# Patient Record
Sex: Male | Born: 1950 | Race: White | Hispanic: No | Marital: Married | Smoking: Former smoker
Health system: Southern US, Community
[De-identification: ages and names within clinical notes are randomized; demographics above are authoritative.]

## PROBLEM LIST (undated history)

## (undated) DIAGNOSIS — Z720 Tobacco use: Secondary | ICD-10-CM

## (undated) DIAGNOSIS — I10 Essential (primary) hypertension: Secondary | ICD-10-CM

## (undated) DIAGNOSIS — F419 Anxiety disorder, unspecified: Secondary | ICD-10-CM

## (undated) DIAGNOSIS — J449 Chronic obstructive pulmonary disease, unspecified: Secondary | ICD-10-CM

## (undated) DIAGNOSIS — F1910 Other psychoactive substance abuse, uncomplicated: Secondary | ICD-10-CM

## (undated) DIAGNOSIS — J189 Pneumonia, unspecified organism: Secondary | ICD-10-CM

## (undated) DIAGNOSIS — E782 Mixed hyperlipidemia: Secondary | ICD-10-CM

## (undated) DIAGNOSIS — N059 Unspecified nephritic syndrome with unspecified morphologic changes: Secondary | ICD-10-CM

## (undated) DIAGNOSIS — M109 Gout, unspecified: Secondary | ICD-10-CM

## (undated) HISTORY — DX: Mixed hyperlipidemia: E78.2

## (undated) HISTORY — DX: Anxiety disorder, unspecified: F41.9

## (undated) HISTORY — DX: Essential (primary) hypertension: I10

## (undated) HISTORY — PX: TONSILLECTOMY: SUR1361

---

## 2002-09-17 ENCOUNTER — Encounter: Payer: Self-pay | Admitting: Family Medicine

## 2002-09-17 ENCOUNTER — Ambulatory Visit (HOSPITAL_COMMUNITY): Admission: RE | Admit: 2002-09-17 | Discharge: 2002-09-17 | Payer: Self-pay | Admitting: Family Medicine

## 2004-11-02 ENCOUNTER — Other Ambulatory Visit: Admission: RE | Admit: 2004-11-02 | Discharge: 2004-11-02 | Payer: Self-pay | Admitting: Dermatology

## 2006-12-07 ENCOUNTER — Ambulatory Visit (HOSPITAL_COMMUNITY): Admission: RE | Admit: 2006-12-07 | Discharge: 2006-12-07 | Payer: Self-pay | Admitting: Family Medicine

## 2014-10-25 ENCOUNTER — Emergency Department (HOSPITAL_COMMUNITY): Payer: PRIVATE HEALTH INSURANCE

## 2014-10-25 ENCOUNTER — Emergency Department (HOSPITAL_COMMUNITY)
Admission: EM | Admit: 2014-10-25 | Discharge: 2014-10-25 | Disposition: A | Discharge status: Home / Self Care | Payer: PRIVATE HEALTH INSURANCE | Attending: Emergency Medicine | Admitting: Emergency Medicine

## 2014-10-25 ENCOUNTER — Encounter (HOSPITAL_COMMUNITY): Payer: Self-pay

## 2014-10-25 DIAGNOSIS — R0600 Dyspnea, unspecified: Secondary | ICD-10-CM | POA: Insufficient documentation

## 2014-10-25 DIAGNOSIS — R2242 Localized swelling, mass and lump, left lower limb: Secondary | ICD-10-CM | POA: Insufficient documentation

## 2014-10-25 DIAGNOSIS — Z87448 Personal history of other diseases of urinary system: Secondary | ICD-10-CM | POA: Diagnosis not present

## 2014-10-25 DIAGNOSIS — M109 Gout, unspecified: Secondary | ICD-10-CM

## 2014-10-25 DIAGNOSIS — Z8701 Personal history of pneumonia (recurrent): Secondary | ICD-10-CM | POA: Insufficient documentation

## 2014-10-25 DIAGNOSIS — R4182 Altered mental status, unspecified: Secondary | ICD-10-CM | POA: Diagnosis not present

## 2014-10-25 DIAGNOSIS — M10062 Idiopathic gout, left knee: Secondary | ICD-10-CM | POA: Diagnosis not present

## 2014-10-25 DIAGNOSIS — Z720 Tobacco use: Secondary | ICD-10-CM | POA: Insufficient documentation

## 2014-10-25 DIAGNOSIS — R000 Tachycardia, unspecified: Secondary | ICD-10-CM | POA: Diagnosis not present

## 2014-10-25 DIAGNOSIS — M7989 Other specified soft tissue disorders: Secondary | ICD-10-CM

## 2014-10-25 DIAGNOSIS — R0609 Other forms of dyspnea: Secondary | ICD-10-CM

## 2014-10-25 HISTORY — DX: Pneumonia, unspecified organism: J18.9

## 2014-10-25 HISTORY — DX: Unspecified nephritic syndrome with unspecified morphologic changes: N05.9

## 2014-10-25 HISTORY — DX: Gout, unspecified: M10.9

## 2014-10-25 LAB — CBC WITH DIFFERENTIAL/PLATELET
Basophils Absolute: 0 K/uL (ref 0.0–0.1)
Basophils Relative: 0 % (ref 0–1)
Eosinophils Absolute: 0 K/uL (ref 0.0–0.7)
Eosinophils Relative: 0 % (ref 0–5)
HCT: 42.1 % (ref 39.0–52.0)
Hemoglobin: 14.7 g/dL (ref 13.0–17.0)
Lymphocytes Relative: 11 % — ABNORMAL LOW (ref 12–46)
Lymphs Abs: 1.6 K/uL (ref 0.7–4.0)
MCH: 36.3 pg — ABNORMAL HIGH (ref 26.0–34.0)
MCHC: 34.9 g/dL (ref 30.0–36.0)
MCV: 104 fL — ABNORMAL HIGH (ref 78.0–100.0)
Monocytes Absolute: 2.9 K/uL — ABNORMAL HIGH (ref 0.1–1.0)
Monocytes Relative: 21 % — ABNORMAL HIGH (ref 3–12)
Neutro Abs: 9.3 K/uL — ABNORMAL HIGH (ref 1.7–7.7)
Neutrophils Relative %: 67 % (ref 43–77)
Platelets: 235 K/uL (ref 150–400)
RBC: 4.05 MIL/uL — ABNORMAL LOW (ref 4.22–5.81)
RDW: 12.9 % (ref 11.5–15.5)
WBC: 13.8 K/uL — ABNORMAL HIGH (ref 4.0–10.5)

## 2014-10-25 LAB — BASIC METABOLIC PANEL WITH GFR
Anion gap: 9 (ref 5–15)
BUN: 10 mg/dL (ref 6–23)
CO2: 23 mmol/L (ref 19–32)
Calcium: 8.3 mg/dL — ABNORMAL LOW (ref 8.4–10.5)
Chloride: 98 meq/L (ref 96–112)
Creatinine, Ser: 0.54 mg/dL (ref 0.50–1.35)
GFR calc Af Amer: >90 mL/min
GFR calc non Af Amer: >90 mL/min
Glucose, Bld: 126 mg/dL — ABNORMAL HIGH (ref 70–99)
Potassium: 3.2 mmol/L — ABNORMAL LOW (ref 3.5–5.1)
Sodium: 130 mmol/L — ABNORMAL LOW (ref 135–145)

## 2014-10-25 LAB — URIC ACID: URIC ACID, SERUM: 8 mg/dL — AB (ref 4.0–7.8)

## 2014-10-25 LAB — ETHANOL: Alcohol, Ethyl (B): <5 mg/dL (ref 0–9)

## 2014-10-25 MED ORDER — KETOROLAC TROMETHAMINE 30 MG/ML IJ SOLN
30.0000 mg | Freq: Once | INTRAMUSCULAR | Status: AC
  Administered 2014-10-25: 30 mg via INTRAVENOUS
  Filled 2014-10-25: qty 1

## 2014-10-25 MED ORDER — OXYCODONE-ACETAMINOPHEN 5-325 MG PO TABS
2.0000 | ORAL_TABLET | Freq: Once | ORAL | Status: AC
  Administered 2014-10-25: 2 via ORAL
  Filled 2014-10-25: qty 2

## 2014-10-25 MED ORDER — COLCHICINE 0.6 MG PO TABS
0.6000 mg | ORAL_TABLET | Freq: Once | ORAL | Status: AC
  Administered 2014-10-25: 0.6 mg via ORAL
  Filled 2014-10-25: qty 1

## 2014-10-25 MED ORDER — IOHEXOL 350 MG/ML SOLN
100.0000 mL | Freq: Once | INTRAVENOUS | Status: AC | PRN
  Administered 2014-10-25: 100 mL via INTRAVENOUS

## 2014-10-25 MED ORDER — SODIUM CHLORIDE 0.9 % IV SOLN
INTRAVENOUS | Status: DC
  Administered 2014-10-25: 03:00:00 via INTRAVENOUS

## 2014-10-25 NOTE — ED Notes (Signed)
Patient states he noticed swelling to left knee 3 days ago. Patient has been flying for the past 30 hours, when landed tonight patient unable to ambulate due to pain and swelling to bilateral feet. Patient also states shortness of breath.
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
## 2014-10-25 NOTE — ED Notes (Signed)
Pt ambulated well, with no difficulty.

## 2014-10-25 NOTE — ED Provider Notes (Signed)
7:02 AM Received sign out from Dr. Tomi Bamberger 64 y.o. Male recent trip overseas who presents complaining of left leg pain with known gout and doe.  CT angio ok.  Doppler US of leg at 0900.  CT of head due to patient "not acting right" but bottle of xanax in car.  Likely d/c after doppler.   Shaune Pollack, MD 10/29/14 [phone]

## 2014-10-25 NOTE — ED Provider Notes (Signed)
This note was scribed for Cesar Pollack, MD by Stephania Fragmin, ED Scribe. This patient was seen in room APA08/[facility]08 at 11:34 AM.   11:34 AM - Patient has been informed of negative US impressions of DVT and positive impressions of Baker cysts. He verbalizes understanding, although he has concerns about ambulating. Patient is offered overnight stay for monitoring of leg pain.  Patient states his BLE pain has decreased since coming here, although he still has left knee pain. He believes his symptoms are due to a flareup of gout. He denies fever and history of pedal edema. His PCP is Dr. Gerarda Fraction.   Per wife, patient had a blood panel recently done, with triglycerides elevated at 722.  Patient with [place] ble with no dvt PE bilateral foot swelling with left knee fluid Patient ambulated and again with walker.  Feels improved with walker- plan outpatient rx  Patient feels swelling like prior gout and will continue gout meds.  Patient advised re avoiding mental status altering medication.  Likely some erythema and swelling superimposed from extended flight time- patient advised to keep feet up down only when walking.  Advised of return precautions an dneed for follow up and voices understanding.   Cesar Pollack, MD 10/25/14 [phone]

## 2014-10-25 NOTE — ED Notes (Signed)
Patient resting, eyes closed, even rise and fall of chest. No distress noted.

## 2014-10-25 NOTE — Discharge Instructions (Signed)
Please keep feet elevated above heart except when walking.  Recheck with Dr. Gerarda Fraction on Monday.  Return if worse at any time.  Avoid all sedative medications.
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
## 2014-12-07 ENCOUNTER — Emergency Department (HOSPITAL_COMMUNITY)
Admission: EM | Admit: 2014-12-07 | Discharge: 2014-12-07 | Disposition: A | Discharge status: Home / Self Care | Payer: PRIVATE HEALTH INSURANCE | Attending: Emergency Medicine | Admitting: Emergency Medicine

## 2014-12-07 ENCOUNTER — Encounter (HOSPITAL_COMMUNITY): Payer: Self-pay | Admitting: Emergency Medicine

## 2014-12-07 ENCOUNTER — Emergency Department (HOSPITAL_COMMUNITY): Payer: PRIVATE HEALTH INSURANCE

## 2014-12-07 DIAGNOSIS — Z720 Tobacco use: Secondary | ICD-10-CM | POA: Diagnosis not present

## 2014-12-07 DIAGNOSIS — Z87448 Personal history of other diseases of urinary system: Secondary | ICD-10-CM | POA: Diagnosis not present

## 2014-12-07 DIAGNOSIS — J159 Unspecified bacterial pneumonia: Secondary | ICD-10-CM | POA: Diagnosis not present

## 2014-12-07 DIAGNOSIS — Z79899 Other long term (current) drug therapy: Secondary | ICD-10-CM | POA: Diagnosis not present

## 2014-12-07 DIAGNOSIS — R509 Fever, unspecified: Secondary | ICD-10-CM | POA: Diagnosis present

## 2014-12-07 DIAGNOSIS — M109 Gout, unspecified: Secondary | ICD-10-CM | POA: Diagnosis not present

## 2014-12-07 DIAGNOSIS — J189 Pneumonia, unspecified organism: Secondary | ICD-10-CM

## 2014-12-07 MED ORDER — CEFDINIR 300 MG PO CAPS
300.0000 mg | ORAL_CAPSULE | Freq: Two times a day (BID) | ORAL | Status: DC
Start: 2014-12-07 — End: 2014-12-15

## 2014-12-07 MED ORDER — LIDOCAINE HCL (PF) 1 % IJ SOLN
INTRAMUSCULAR | Status: AC
  Administered 2014-12-07: 5 mL
  Filled 2014-12-07: qty 5

## 2014-12-07 MED ORDER — ALBUTEROL SULFATE HFA 108 (90 BASE) MCG/ACT IN AERS: 2.0000 | INHALATION_SPRAY | RESPIRATORY_TRACT | Status: DC | PRN

## 2014-12-07 MED ORDER — CEFTRIAXONE SODIUM 1 G IJ SOLR
1.0000 g | Freq: Once | INTRAMUSCULAR | Status: AC
  Administered 2014-12-07: 1 g via INTRAMUSCULAR
  Filled 2014-12-07: qty 10

## 2014-12-07 MED ORDER — HYDROCODONE-HOMATROPINE 5-1.5 MG/5ML PO SYRP: 5.0000 mL | ORAL_SOLUTION | Freq: Four times a day (QID) | ORAL | Status: DC | PRN

## 2014-12-07 NOTE — ED Provider Notes (Signed)
CSN: [id number]     Arrival date & time 12/07/14  1232 History  This chart was scribed for Orpah Greek, * by Dellis Filbert, ED Scribe. The patient was seen in [facility] and the patient's care was started at 1:16 PM.  Chief Complaint  Patient presents with  . Fever  . Cough   Patient is a 64 y.o. male presenting with fever and cough. The history is provided by the spouse and the patient. No language interpreter was used.  Fever Temp source:  Subjective Severity:  Mild Onset quality:  Sudden Duration:  2 days Timing:  Constant Chronicity:  New Relieved by:  Nothing Associated symptoms: cough   Cough Associated symptoms: fever     HPI Comments: MATTIE NOVOSEL is a 64 y.o. male who presents to the Emergency Department complaining of flu like symptoms which first developed 2 days ago around 10:00 AM . He has a fever, cough, chills, diarrhea, loss of appetite, SOB and a HA. His PCP called in a Z-pak which has not provided relief. He has not had any breathing treatments at home. No vomiting. Pt has gout in the left knee and he is currently taking prednisone for it.  Past Medical History  Diagnosis Date  . Pneumonia   . Gout   . Nephritis    Past Surgical History  Procedure Laterality Date  . Tonsillectomy     History reviewed. No pertinent family history. History  Substance Use Topics  . Smoking status: Current Every Day Smoker -- 1.00 packs/day    Types: Cigarettes  . Smokeless tobacco: Not on file  . Alcohol Use: Yes     Comment: 2-3 times per week    Review of Systems  Constitutional: Positive for fever.  Respiratory: Positive for cough.   All other systems reviewed and are negative.   Allergies  Nsaids  Home Medications   Prior to Admission medications   Medication Sig Start Date End Date Taking? Authorizing Provider  colchicine (COLCRYS) 0.6 MG tablet Take 0.6 mg by mouth daily as needed (gout flare up).   Yes Historical Provider, MD  Omega-3  Fatty Acids (FISH OIL) 1000 MG CAPS Take 1,000 mg by mouth daily.   Yes Historical Provider, MD   BP 155/74 mmHg  Pulse 80  Temp(Src) 99.9 F (37.7 C) (Oral)  Resp 16  Ht 6\' 2"  (1.88 m)  Wt 200 lb (90.719 kg)  BMI 25.67 kg/m2  SpO2 97% Physical Exam  Constitutional: He is oriented to person, place, and time. He appears well-developed and well-nourished. No distress.  HENT:  Head: Normocephalic and atraumatic.  Right Ear: Hearing normal.  Left Ear: Hearing normal.  Nose: Nose normal.  Mouth/Throat: Oropharynx is clear and moist and mucous membranes are normal.  Eyes: Conjunctivae and EOM are normal. Pupils are equal, round, and reactive to light.  Neck: Normal range of motion. Neck supple.  Cardiovascular: Regular rhythm, S1 normal and S2 normal.  Exam reveals no gallop and no friction rub.   No murmur heard. Pulmonary/Chest: Effort normal and breath sounds normal. No respiratory distress. He has no wheezes. He has no rales. He exhibits no tenderness.  Diminished breathing sounds on both sides.  Abdominal: Soft. Normal appearance and bowel sounds are normal. There is no hepatosplenomegaly. There is no tenderness. There is no rebound, no guarding, no tenderness at McBurney's point and negative Murphy's sign. No hernia.  Musculoskeletal: Normal range of motion.  Neurological: He is alert and oriented to person,  place, and time. He has normal strength. No cranial nerve deficit or sensory deficit. Coordination normal. GCS eye subscore is 4. GCS verbal subscore is 5. GCS motor subscore is 6.  Skin: Skin is warm, dry and intact. No rash noted. No cyanosis.  Psychiatric: He has a normal mood and affect. His speech is normal and behavior is normal. Thought content normal.  Nursing note and vitals reviewed.   ED Course  Procedures  DIAGNOSTIC STUDIES: Oxygen Saturation is 97% on room air, normal by my interpretation.    COORDINATION OF CARE: 1:21 PM Discussed treatment plan with pt at  bedside and pt agreed to plan.  Labs Review Labs Reviewed - No data to display  Imaging Review Dg Chest 2 View  12/07/2014   CLINICAL DATA:  Productive cough for 2 days.  Smoking history.  EXAM: CHEST  2 VIEW  COMPARISON:  Chest CT 10/25/2014  FINDINGS: The cardiac silhouette, mediastinal and hilar contours are within normal limits and stable. Asymmetric streaky airspace opacity in the left lower lobe suspicious for bronchopneumonia. No pleural effusion. No pulmonary lesions.  IMPRESSION: Suspect left lower lobe bronchopneumonia   Electronically Signed   By: Marijo Sanes M.D.   On: 12/07/2014 13:49     EKG Interpretation None      MDM   Final diagnoses:  None   Community-acquired pneumonia  Patient presents to the ER for evaluation of fever and cough. Patient has had symptoms for 3 days. His primary care doctor called in a Z-Pak but he has not noticed improvement. Patient reports increased cough when he lays down. He is not hypoxic here in the ER. Vital signs are normal except for very slight hypertension and low-grade fever.  Chest x-ray shows early left lower lobe pneumonia. Patient has taken 3 days of Zithromax. He is already on prednisone taper for gout. Will continue these. Patient administered Rocephin IM and will consider Omnicef as an outpatient. 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 Will add Hycodan for cough. Follow-up with primary doctor, return if symptoms worsen.  I personally performed the services described in this documentation, which was scribed in my presence. The recorded information has been reviewed and is accurate.       Orpah Greek, MD 12/07/14 [phone]

## 2014-12-07 NOTE — Discharge Instructions (Signed)

## 2014-12-07 NOTE — ED Notes (Signed)
Patient with no complaints at this time. Respirations even and unlabored. Skin warm/dry. Discharge instructions reviewed with patient at this time. Patient given opportunity to voice concerns/ask questions. Patient discharged at this time and left Emergency Department with steady gait.   

## 2014-12-07 NOTE — ED Notes (Signed)
Cold symptoms since Friday morning.  Cough, fever, headache and poor appetite.

## 2014-12-07 NOTE — ED Notes (Addendum)
3 day hx of fever, cough, progressive SOB. Tmax at home 101.  Mild sore throat, denies sinus congestion.  Last night had to sit up to sleep d/t excessive coughing lying down. PCP called in [place] for patient on Friday.  He has had no improvement since beginning treatment.  He is also taking Prednisone for gout and OTC Mucinex for cough.

## 2014-12-12 ENCOUNTER — Inpatient Hospital Stay (HOSPITAL_COMMUNITY)
Admission: EM | Admit: 2014-12-12 | Discharge: 2014-12-15 | DRG: 195 | Disposition: A | Discharge status: Home / Self Care | Payer: PRIVATE HEALTH INSURANCE | Attending: Internal Medicine | Admitting: Internal Medicine

## 2014-12-12 ENCOUNTER — Inpatient Hospital Stay (HOSPITAL_COMMUNITY): Payer: PRIVATE HEALTH INSURANCE

## 2014-12-12 ENCOUNTER — Emergency Department (HOSPITAL_COMMUNITY): Payer: PRIVATE HEALTH INSURANCE

## 2014-12-12 ENCOUNTER — Encounter (HOSPITAL_COMMUNITY): Payer: Self-pay | Admitting: Emergency Medicine

## 2014-12-12 DIAGNOSIS — J189 Pneumonia, unspecified organism: Principal | ICD-10-CM | POA: Diagnosis present

## 2014-12-12 DIAGNOSIS — F17210 Nicotine dependence, cigarettes, uncomplicated: Secondary | ICD-10-CM | POA: Diagnosis present

## 2014-12-12 DIAGNOSIS — Z8701 Personal history of pneumonia (recurrent): Secondary | ICD-10-CM | POA: Diagnosis not present

## 2014-12-12 DIAGNOSIS — Z825 Family history of asthma and other chronic lower respiratory diseases: Secondary | ICD-10-CM

## 2014-12-12 DIAGNOSIS — M109 Gout, unspecified: Secondary | ICD-10-CM | POA: Diagnosis present

## 2014-12-12 DIAGNOSIS — R0902 Hypoxemia: Secondary | ICD-10-CM

## 2014-12-12 HISTORY — DX: Other psychoactive substance abuse, uncomplicated: F19.10

## 2014-12-12 LAB — COMPREHENSIVE METABOLIC PANEL
ALK PHOS: 63 U/L (ref 39–117)
ALT: 54 U/L — AB (ref 0–53)
AST: 69 U/L — AB (ref 0–37)
Albumin: 3.2 g/dL — ABNORMAL LOW (ref 3.5–5.2)
Anion gap: 7 (ref 5–15)
BUN: 6 mg/dL (ref 6–23)
CO2: 32 mmol/L (ref 19–32)
Calcium: 8.3 mg/dL — ABNORMAL LOW (ref 8.4–10.5)
Chloride: 93 mmol/L — ABNORMAL LOW (ref 96–112)
Creatinine, Ser: 0.61 mg/dL (ref 0.50–1.35)
GFR calc non Af Amer: >90 mL/min (ref >90)
GLUCOSE: 102 mg/dL — AB (ref 70–99)
POTASSIUM: 4.2 mmol/L (ref 3.5–5.1)
Sodium: 132 mmol/L — ABNORMAL LOW (ref 135–145)
TOTAL PROTEIN: 6.6 g/dL (ref 6.0–8.3)
Total Bilirubin: 0.8 mg/dL (ref 0.3–1.2)

## 2014-12-12 LAB — CBC WITH DIFFERENTIAL/PLATELET
BASOS ABS: 0 10*3/uL (ref 0.0–0.1)
BASOS PCT: 0 % (ref 0–1)
EOS ABS: 0 10*3/uL (ref 0.0–0.7)
Eosinophils Relative: 0 % (ref 0–5)
HCT: 38.7 % — ABNORMAL LOW (ref 39.0–52.0)
HEMOGLOBIN: 13.6 g/dL (ref 13.0–17.0)
Lymphocytes Relative: 22 % (ref 12–46)
Lymphs Abs: 1.7 10*3/uL (ref 0.7–4.0)
MCH: 34 pg (ref 26.0–34.0)
MCHC: 35.1 g/dL (ref 30.0–36.0)
MCV: 96.8 fL (ref 78.0–100.0)
Monocytes Absolute: 0.6 10*3/uL (ref 0.1–1.0)
Monocytes Relative: 8 % (ref 3–12)
NEUTROS ABS: 5.1 10*3/uL (ref 1.7–7.7)
NEUTROS PCT: 70 % (ref 43–77)
PLATELETS: 204 10*3/uL (ref 150–400)
RBC: 4 MIL/uL — ABNORMAL LOW (ref 4.22–5.81)
RDW: 12.2 % (ref 11.5–15.5)
WBC: 7.4 10*3/uL (ref 4.0–10.5)

## 2014-12-12 LAB — URINALYSIS, ROUTINE W REFLEX MICROSCOPIC
BILIRUBIN URINE: NEGATIVE
Glucose, UA: NEGATIVE mg/dL
Hgb urine dipstick: NEGATIVE
Leukocytes, UA: NEGATIVE
Nitrite: NEGATIVE
PH: 7.5 (ref 5.0–8.0)
Protein, ur: 30 mg/dL — AB
SPECIFIC GRAVITY, URINE: 1.015 (ref 1.005–1.030)
UROBILINOGEN UA: 0.2 mg/dL (ref 0.0–1.0)

## 2014-12-12 LAB — RAPID URINE DRUG SCREEN, HOSP PERFORMED
AMPHETAMINES: NOT DETECTED
BARBITURATES: NOT DETECTED
Benzodiazepines: NOT DETECTED
Cocaine: NOT DETECTED
OPIATES: NOT DETECTED
TETRAHYDROCANNABINOL: NOT DETECTED

## 2014-12-12 LAB — ETHANOL: Alcohol, Ethyl (B): <5 mg/dL (ref 0–9)

## 2014-12-12 LAB — TROPONIN I: Troponin I: <0.03 ng/mL (ref <0.031)

## 2014-12-12 LAB — URINE MICROSCOPIC-ADD ON

## 2014-12-12 LAB — AMMONIA: Ammonia: <9 umol/L — ABNORMAL LOW (ref 11–32)

## 2014-12-12 MED ORDER — ALBUTEROL SULFATE (2.5 MG/3ML) 0.083% IN NEBU: 2.5000 mg | INHALATION_SOLUTION | RESPIRATORY_TRACT | Status: DC | PRN

## 2014-12-12 MED ORDER — DEXTROSE 5 % IV SOLN
500.0000 mg | INTRAVENOUS | Status: DC
  Administered 2014-12-12 – 2014-12-13 (×2): 500 mg via INTRAVENOUS
  Filled 2014-12-12 (×3): qty 500

## 2014-12-12 MED ORDER — LEVOFLOXACIN IN D5W 750 MG/150ML IV SOLN
750.0000 mg | Freq: Once | INTRAVENOUS | Status: AC
  Administered 2014-12-12: 750 mg via INTRAVENOUS
  Filled 2014-12-12: qty 150

## 2014-12-12 MED ORDER — HYDROCODONE-HOMATROPINE 5-1.5 MG/5ML PO SYRP
5.0000 mL | ORAL_SOLUTION | Freq: Four times a day (QID) | ORAL | Status: DC | PRN
  Administered 2014-12-12 – 2014-12-14 (×4): 5 mL via ORAL
  Filled 2014-12-12 (×4): qty 5

## 2014-12-12 MED ORDER — OMEGA-3-ACID ETHYL ESTERS 1 G PO CAPS
1.0000 g | ORAL_CAPSULE | Freq: Every day | ORAL | Status: DC
  Administered 2014-12-12 – 2014-12-15 (×4): 1 g via ORAL
  Filled 2014-12-12 (×4): qty 1

## 2014-12-12 MED ORDER — COLCHICINE 0.6 MG PO TABS: 0.6000 mg | ORAL_TABLET | Freq: Every day | ORAL | Status: DC | PRN

## 2014-12-12 MED ORDER — IOHEXOL 300 MG/ML  SOLN
80.0000 mL | Freq: Once | INTRAMUSCULAR | Status: AC | PRN
  Administered 2014-12-12: 80 mL via INTRAVENOUS

## 2014-12-12 MED ORDER — DEXTROSE 5 % IV SOLN
1.0000 g | INTRAVENOUS | Status: DC
  Administered 2014-12-12 – 2014-12-13 (×2): 1 g via INTRAVENOUS
  Filled 2014-12-12 (×3): qty 10

## 2014-12-12 MED ORDER — LEVOFLOXACIN 500 MG PO TABS: 500.0000 mg | ORAL_TABLET | Freq: Once | ORAL | Status: DC

## 2014-12-12 MED ORDER — IPRATROPIUM-ALBUTEROL 0.5-2.5 (3) MG/3ML IN SOLN
3.0000 mL | Freq: Once | RESPIRATORY_TRACT | Status: AC
  Administered 2014-12-12: 3 mL via RESPIRATORY_TRACT
  Filled 2014-12-12: qty 3

## 2014-12-12 MED ORDER — HEPARIN SODIUM (PORCINE) 5000 UNIT/ML IJ SOLN
5000.0000 [IU] | Freq: Three times a day (TID) | INTRAMUSCULAR | Status: DC
  Administered 2014-12-12 – 2014-12-15 (×8): 5000 [IU] via SUBCUTANEOUS
  Filled 2014-12-12 (×9): qty 1

## 2014-12-12 MED ORDER — SODIUM CHLORIDE 0.9 % IV BOLUS (SEPSIS)
1000.0000 mL | Freq: Once | INTRAVENOUS | Status: AC
  Administered 2014-12-12: 1000 mL via INTRAVENOUS

## 2014-12-12 MED ORDER — SODIUM CHLORIDE 0.9 % IJ SOLN
3.0000 mL | Freq: Two times a day (BID) | INTRAMUSCULAR | Status: DC
  Administered 2014-12-12 – 2014-12-15 (×6): 3 mL via INTRAVENOUS

## 2014-12-12 MED ORDER — METHYLPREDNISOLONE SODIUM SUCC 125 MG IJ SOLR
125.0000 mg | Freq: Once | INTRAMUSCULAR | Status: AC
  Administered 2014-12-12: 125 mg via INTRAVENOUS
  Filled 2014-12-12: qty 2

## 2014-12-12 MED ORDER — ALBUTEROL SULFATE HFA 108 (90 BASE) MCG/ACT IN AERS
2.0000 | INHALATION_SPRAY | RESPIRATORY_TRACT | Status: DC | PRN
  Filled 2014-12-12: qty 6.7

## 2014-12-12 NOTE — ED Provider Notes (Signed)
This chart was scribed for [place], DO by Einar Pheasant, ED Scribe. This patient was seen in room APA05/APA05 and the patient's care was started at 12:51 PM.  TIME SEEN: 12:51 PM  CHIEF COMPLAINT: Cough  HPI:  HPI Comments: Cesar Gonzalez is a 64 y.o. male with history of tobacco use who presents to the Emergency Department complaining of gradual onset persistent productive cough with light yellow sputum that started 5 days ago. He also endorses associated generalized weakness and shortness of breath worse with exertion. Pt states that he was recently diagnosed with pneumonia and received a Rocephin IM injection. He was d/c home with a 10 day course of Omnicef. Last dose this morning. Pt states that he is not feeling better and feels like he is feeling worse. Wife reports he has been intermittently confused. Denies chest pain. No vomiting or diarrhea. No numbness, tingling or focal weakness. Does not wear oxygen at home.    ROS: See HPI Constitutional: Subjective fevers at home Eyes: no drainage  ENT: no runny nose   Cardiovascular:  no chest pain  Resp:positive cough and SOB GI: no vomiting GU: no dysuria Integumentary: no rash  Allergy: no hives  Musculoskeletal: no leg swelling  Neurological: no slurred speech ROS otherwise negative  PAST MEDICAL HISTORY/PAST SURGICAL HISTORY:  Past Medical History  Diagnosis Date  . Pneumonia   . Gout   . Nephritis     MEDICATIONS:  Prior to Admission medications   Medication Sig Start Date End Date Taking? Authorizing Provider  albuterol (PROVENTIL HFA;VENTOLIN HFA) 108 (90 BASE) MCG/ACT inhaler Inhale 2 puffs into the lungs every 4 (four) hours as needed for wheezing or shortness of breath. 12/07/14  Yes Orpah Greek, MD  cefdinir (OMNICEF) 300 MG capsule Take 1 capsule (300 mg total) by mouth 2 (two) times daily. 12/07/14  Yes Orpah Greek, MD  colchicine (COLCRYS) 0.6 MG tablet Take 0.6 mg by mouth daily as  needed (gout flare up).   Yes Historical Provider, MD  HYDROcodone-homatropine (HYCODAN) 5-1.5 MG/5ML syrup Take 5 mLs by mouth every 6 (six) hours as needed for cough. 12/07/14  Yes Orpah Greek, MD  Multiple Vitamins-Minerals (SUPER MULTI-VITAMIN PO) Take 1 Package by mouth daily.   Yes Historical Provider, MD  Omega-3 Fatty Acids (FISH OIL) 1000 MG CAPS Take 1,000 mg by mouth daily.   Yes Historical Provider, MD    ALLERGIES:  Allergies  Allergen Reactions  . Nsaids Other (See Comments)    Patient states he "just isn't suppose to take them"    SOCIAL HISTORY:  History  Substance Use Topics  . Smoking status: Current Every Day Smoker -- 1.00 packs/day    Types: Cigarettes  . Smokeless tobacco: Not on file  . Alcohol Use: Yes     Comment: 2-3 times per week    FAMILY HISTORY: History reviewed. No pertinent family history.  EXAM: BP 125/62 mmHg  Pulse 78  Temp(Src) 92 F (33.3 C) (Oral)  Resp 18  Ht 6\' 2"  (1.88 m)  Wt 200 lb (90.719 kg)  BMI 25.67 kg/m2  SpO2 92% CONSTITUTIONAL: Alert and oriented and responds appropriately to questions. Well-appearing; well-nourished HEAD: Normocephalic EYES: Conjunctivae clear, PERRL ENT: normal nose; no rhinorrhea; moist mucous membranes; pharynx without lesions noted NECK: Supple, no meningismus, no LAD  CARD: RRR; S1 and S2 appreciated; no murmurs, no clicks, no rubs, no gallops RESP: Normal chest excursion without splinting or tachypnea; diminished breath sounds bilaterally; no  wheezes, no rhonchi, no rales, patient is hypoxic on room air but in no respiratory distress ABD/GI: Normal bowel sounds; non-distended; soft, non-tender, no rebound, no guarding BACK:  The back appears normal and is non-tender to palpation, there is no CVA tenderness EXT: Normal ROM in all joints; non-tender to palpation; no edema; normal capillary refill; no cyanosis; no calf tenderness or swelling    SKIN: Normal color for age and race;  warm NEURO: Moves all extremities equally, sensation to light touch intact diffusely, cranial nerves II through XII intact, normal gait, oriented 3 PSYCH: The patient's mood and manner are appropriate. Grooming and personal hygiene are appropriate.  MEDICAL DECISION MAKING: Patient here with shortness of breath, new onset hypoxia. Concern for worsening pneumonia. He is a smoker. Will give breathing treatments, Solu-Medrol. We'll obtain labs, urine. EKG shows no new ischemic changes.  ED PROGRESS:   Patient's labs are unremarkable. No leukocytosis. Alcohol level, urine drug screen and ammonia are normal. Chest x-ray however shows partial improvement of the left infiltrate in his lower lobe that he now has a new infiltrate in the right upper lobe since being on antibiotics. Given he has failed outpatient treatment and is mildly hypoxic. It for community-acquired pneumonia. We'll give IV Levaquin. Blood cultures pending.  PCP is [place].  3:38 PM  D/w Dr. Anastasio Champion for admission to inpt, medical bed.   EKG Interpretation  Date/Time:  Friday December 12 2014 13:36:04 EST Ventricular Rate:  98 PR Interval:  179 QRS Duration: 86 QT Interval:  355 QTC Calculation: 453 R Axis:   39 Text Interpretation:  Sinus rhythm Consider left atrial enlargement Probable anteroseptal infarct, old No significant change since last tracing Confirmed by WARD,  DO, KRISTEN ([id number]) on 12/12/2014 1:49:38 PM         I personally performed the services described in this documentation, which was scribed in my presence. The recorded information has been reviewed and is accurate.    [place], DO 12/12/14 1540

## 2014-12-12 NOTE — H&P (Signed)
[facility] History and Physical  LARAMIE MEISSNER WEX:[id number] DOB: [birth year] DOA: 12/12/2014  Referring physician: ER PCP: Glo Herring., MD   Chief Complaint: Cough  HPI: QUENTEZ LOBER is a 64 y.o. male  This is a 64 year old man, smoker, who presents with gradual onset of persistent productive cough for the last week or so. The cough was productive of yellow sputum. He has been also getting more short of breath with exertion for the last few days. He feels weak. He was in the emergency room a few days ago and was treated with antibiotics by injection and sent home with oral antibiotics. He has now not improved and he comes back. He denies any hemoptysis, weight loss, chest pain, nausea or vomiting. When he was seen in emergency room a couple days ago, he was diagnosed with left-sided pneumonia. Evaluation in the emergency room today shows that the left-sided pneumonia symptoms to have improved some degree but he now has a new infiltrate in the right lung. He is now being admitted for further management. Of note, he spent 1 week in [place] in the month of January.  Review of Systems:  Apart from symptoms above, all systems negative.  Past Medical History  Diagnosis Date  . Pneumonia   . Gout   . Nephritis   . Substance abuse    Past Surgical History  Procedure Laterality Date  . Tonsillectomy     Social History:  reports that he has been smoking Cigarettes.  He has been smoking about 1.00 pack per day. He does not have any smokeless tobacco history on file. He reports that he drinks alcohol. He reports that he does not use illicit drugs.  Allergies  Allergen Reactions  . Nsaids Other (See Comments)    Patient states he "just isn't suppose to take them"     Family history: No family history of lung disease apart from his father who had COPD and was a smoker.   Prior to Admission medications   Medication Sig Start Date End Date Taking? Authorizing Provider    albuterol (PROVENTIL HFA;VENTOLIN HFA) 108 (90 BASE) MCG/ACT inhaler Inhale 2 puffs into the lungs every 4 (four) hours as needed for wheezing or shortness of breath. 12/07/14  Yes Orpah Greek, MD  cefdinir (OMNICEF) 300 MG capsule Take 1 capsule (300 mg total) by mouth 2 (two) times daily. 12/07/14  Yes Orpah Greek, MD  colchicine (COLCRYS) 0.6 MG tablet Take 0.6 mg by mouth daily as needed (gout flare up).   Yes Historical Provider, MD  HYDROcodone-homatropine (HYCODAN) 5-1.5 MG/5ML syrup Take 5 mLs by mouth every 6 (six) hours as needed for cough. 12/07/14  Yes Orpah Greek, MD  Multiple Vitamins-Minerals (SUPER MULTI-VITAMIN PO) Take 1 Package by mouth daily.   Yes Historical Provider, MD  Omega-3 Fatty Acids (FISH OIL) 1000 MG CAPS Take 1,000 mg by mouth daily.   Yes Historical Provider, MD   Physical Exam: Filed Vitals:   12/12/14 1500 12/12/14 1530 12/12/14 1600 12/12/14 1630  BP: 121/62 122/63 129/73 126/59  Pulse: 86 93 89 87  Temp:      TempSrc:      Resp: 22 21 23 23   Height:      Weight:      SpO2: 96% 95% 95% 94%    Wt Readings from Last 3 Encounters:  12/12/14 90.719 kg (200 lb)  12/07/14 90.719 kg (200 lb)  10/25/14 90.719 kg (200 lb)    General:  Appears calm and comfortable. No respiratory distress. No peripheral or central cyanosis. No clubbing. Eyes: PERRL, normal lids, irises & conjunctiva ENT: grossly normal hearing, lips & tongue Neck: no LAD, masses or thyromegaly Cardiovascular: RRR, no m/r/g. No LE edema. Telemetry: SR, no arrhythmias  Respiratory: Reduced air entry bilaterally. No crackles, wheezing or bronchial breathing. Abdomen: soft, ntnd Skin: no rash or induration seen on limited exam Musculoskeletal: grossly normal tone BUE/BLE Psychiatric: grossly normal mood and affect, speech fluent and appropriate Neurologic: grossly non-focal.          Labs on Admission:  Basic Metabolic Panel:  Recent Labs Lab  12/12/14 1229  NA 132*  K 4.2  CL 93*  CO2 32  GLUCOSE 102*  BUN 6  CREATININE 0.61  CALCIUM 8.3*   Liver Function Tests:  Recent Labs Lab 12/12/14 1229  AST 69*  ALT 54*  ALKPHOS 63  BILITOT 0.8  PROT 6.6  ALBUMIN 3.2*   No results for input(s): LIPASE, AMYLASE in the last 168 hours.  Recent Labs Lab 12/12/14 1229  AMMONIA <9*   CBC:  Recent Labs Lab 12/12/14 1229  WBC 7.4  NEUTROABS 5.1  HGB 13.6  HCT 38.7*  MCV 96.8  PLT 204   Cardiac Enzymes:  Recent Labs Lab 12/12/14 1229  TROPONINI <0.03    BNP (last 3 results) No results for input(s): BNP in the last 8760 hours.  ProBNP (last 3 results) No results for input(s): PROBNP in the last 8760 hours.  CBG: No results for input(s): GLUCAP in the last 168 hours.  Radiological Exams on Admission: Dg Chest 2 View  12/12/2014   CLINICAL DATA:  Recent pneumonia with persistent cough  EXAM: CHEST  2 VIEW  COMPARISON:  December 07, 2014  FINDINGS: There is partial clearing of infiltrate from the left base. However, there is new infiltrate in the anterior segment of the right upper lobe adjacent to the minor fissure. Lungs elsewhere clear. Heart size and pulmonary vascularity are normal. No adenopathy. No bone lesions.  IMPRESSION: New infiltrate anterior segment right upper lobe adjacent to the minor fissure. Partial clearing left base infiltrate. Lungs elsewhere clear. No change in cardiac silhouette.   Electronically Signed   By: Lowella Grip III M.D.   On: 12/12/2014 14:07      Assessment/Plan   1. Community-acquired pneumonia. He will be treated with intravenous antibiotics. I'm going to obtain a CT scan of the chest to make sure there is no underlying issues because it appears he has bilateral pneumonia and is a smoker. The infiltrate seen today was not seen on a chest x-ray just a couple of days ago.  Further recommendations will depend on patient's hospital progress.   Code Status: Full code.    DVT Prophylaxis: Heparin.  Family Communication: I discussed the plan with the patient at the bedside.   Disposition Plan: Home when medically stable.   Time spent: 60 minutes.  Doree Albee [facility] Pager [phone].

## 2014-12-12 NOTE — ED Notes (Signed)
Pt reports generalized weakness and cough since Sunday. Pt reports was diagnosed with pneumonia.Pt reports minimal improvement. nad noted.

## 2014-12-12 NOTE — ED Notes (Signed)
Patient ambulated in hall.  Patient stated he did feel "alittle short of breath".  Patient's o2 sat 92% - 97%

## 2014-12-13 LAB — COMPREHENSIVE METABOLIC PANEL
ALT: 51 U/L (ref 0–53)
AST: 51 U/L — ABNORMAL HIGH (ref 0–37)
Albumin: 3.1 g/dL — ABNORMAL LOW (ref 3.5–5.2)
Alkaline Phosphatase: 59 U/L (ref 39–117)
Anion gap: 9 (ref 5–15)
BILIRUBIN TOTAL: 0.7 mg/dL (ref 0.3–1.2)
BUN: 9 mg/dL (ref 6–23)
CO2: 32 mmol/L (ref 19–32)
Calcium: 8.9 mg/dL (ref 8.4–10.5)
Chloride: 95 mmol/L — ABNORMAL LOW (ref 96–112)
Creatinine, Ser: 0.63 mg/dL (ref 0.50–1.35)
GFR calc non Af Amer: >90 mL/min (ref >90)
GLUCOSE: 148 mg/dL — AB (ref 70–99)
POTASSIUM: 4.5 mmol/L (ref 3.5–5.1)
Sodium: 136 mmol/L (ref 135–145)
TOTAL PROTEIN: 6.8 g/dL (ref 6.0–8.3)

## 2014-12-13 LAB — CBC
HEMATOCRIT: 40.5 % (ref 39.0–52.0)
HEMOGLOBIN: 14.4 g/dL (ref 13.0–17.0)
MCH: 34.4 pg — ABNORMAL HIGH (ref 26.0–34.0)
MCHC: 35.6 g/dL (ref 30.0–36.0)
MCV: 96.9 fL (ref 78.0–100.0)
Platelets: 260 10*3/uL (ref 150–400)
RBC: 4.18 MIL/uL — ABNORMAL LOW (ref 4.22–5.81)
RDW: 12.3 % (ref 11.5–15.5)
WBC: 8.1 10*3/uL (ref 4.0–10.5)

## 2014-12-13 LAB — INFLUENZA PANEL BY PCR (TYPE A & B)
H1N1 flu by pcr: NOT DETECTED
INFLBPCR: NEGATIVE
Influenza A By PCR: NEGATIVE

## 2014-12-13 LAB — GLUCOSE, CAPILLARY: Glucose-Capillary: 141 mg/dL — ABNORMAL HIGH (ref 70–99)

## 2014-12-13 LAB — HIV ANTIBODY (ROUTINE TESTING W REFLEX): HIV SCREEN 4TH GENERATION: NONREACTIVE

## 2014-12-13 MED ORDER — NICOTINE 21 MG/24HR TD PT24
21.0000 mg | MEDICATED_PATCH | Freq: Every day | TRANSDERMAL | Status: DC
  Administered 2014-12-13 – 2014-12-15 (×3): 21 mg via TRANSDERMAL
  Filled 2014-12-13 (×3): qty 1

## 2014-12-13 NOTE — Progress Notes (Signed)
[facility] PROGRESS NOTE  Cesar Gonzalez HBZ:[id number] DOB: [birth year] DOA: 12/12/2014 PCP: Glo Herring., MD  Assessment/Plan: CAP -CT confirms multifocal PNA. -Agree with rocephin/azithro. -Cx data remains negative to date. -Afebrile/without leukocytosis. -Patient still feeling poorly. -Check flu PCR.  Code Status: Full Code Family Communication: Patient only  Disposition Plan: [facility] for DC home in 1 -2 days.   Consultants:  None   Antibiotics:  Rocephin  Azithro   Subjective: Still feels "wiped out"  Objective: Filed Vitals:   12/12/14 1748 12/12/14 2138 12/13/14 0543 12/13/14 1230  BP: 134/63 142/71 135/73   Pulse: 84 79 70   Temp: 98.9 F (37.2 C) 98 F (36.7 C) 98.6 F (37 C)   TempSrc: Oral Oral Oral   Resp:  20 20   Height:      Weight:      SpO2: 98% 96% 96% 82%    Intake/Output Summary (Last 24 hours) at 12/13/14 1820 Last data filed at 12/12/14 2300  Gross per 24 hour  Intake      0 ml  Output    600 ml  Net   -600 ml   Filed Weights   12/12/14 1110  Weight: 90.719 kg (200 lb)    Exam:   General:  AA Ox3  Cardiovascular: RRR  Respiratory: CTA B  Abdomen: S/NT/ND/+BS  Extremities: no C/C/E   Neurologic:  Intact/non-focal  Data Reviewed: Basic Metabolic Panel:  Recent Labs Lab 12/12/14 1229 12/13/14 0639  NA 132* 136  K 4.2 4.5  CL 93* 95*  CO2 32 32  GLUCOSE 102* 148*  BUN 6 9  CREATININE 0.61 0.63  CALCIUM 8.3* 8.9   Liver Function Tests:  Recent Labs Lab 12/12/14 1229 12/13/14 0639  AST 69* 51*  ALT 54* 51  ALKPHOS 63 59  BILITOT 0.8 0.7  PROT 6.6 6.8  ALBUMIN 3.2* 3.1*   No results for input(s): LIPASE, AMYLASE in the last 168 hours.  Recent Labs Lab 12/12/14 1229  AMMONIA <9*   CBC:  Recent Labs Lab 12/12/14 1229 12/13/14 0639  WBC 7.4 8.1  NEUTROABS 5.1  --   HGB 13.6 14.4  HCT 38.7* 40.5  MCV 96.8 96.9  PLT 204 260   Cardiac Enzymes:  Recent Labs Lab  12/12/14 1229  TROPONINI <0.03   BNP (last 3 results) No results for input(s): BNP in the last 8760 hours.  ProBNP (last 3 results) No results for input(s): PROBNP in the last 8760 hours.  CBG: No results for input(s): GLUCAP in the last 168 hours.  Recent Results (from the past 240 hour(s))  Blood culture (routine x 2)     Status: None (Preliminary result)   Collection Time: 12/12/14  3:12 PM  Result Value Ref Range Status   Specimen Description BLOOD RIGHT HAND  Final   Special Requests BOTTLES DRAWN AEROBIC AND ANAEROBIC 6CC  Final   Culture NO GROWTH 1 DAY  Final   Report Status PENDING  Incomplete  Blood culture (routine x 2)     Status: None (Preliminary result)   Collection Time: 12/12/14  3:15 PM  Result Value Ref Range Status   Specimen Description BLOOD LEFT HAND  Final   Special Requests BOTTLES DRAWN AEROBIC AND ANAEROBIC 6CC  Final   Culture NO GROWTH 1 DAY  Final   Report Status PENDING  Incomplete     Studies: Dg Chest 2 View  12/12/2014   CLINICAL DATA:  Recent pneumonia with persistent cough  EXAM: CHEST  2 VIEW  COMPARISON:  December 07, 2014  FINDINGS: There is partial clearing of infiltrate from the left base. However, there is new infiltrate in the anterior segment of the right upper lobe adjacent to the minor fissure. Lungs elsewhere clear. Heart size and pulmonary vascularity are normal. No adenopathy. No bone lesions.  IMPRESSION: New infiltrate anterior segment right upper lobe adjacent to the minor fissure. Partial clearing left base infiltrate. Lungs elsewhere clear. No change in cardiac silhouette.   Electronically Signed   By: Lowella Grip III M.D.   On: 12/12/2014 14:07   Ct Chest W Contrast  12/12/2014   CLINICAL DATA:  Community-acquired pneumonia.  EXAM: CT CHEST WITH CONTRAST  TECHNIQUE: Multidetector CT imaging of the chest was performed during intravenous contrast administration.  CONTRAST:  61mL OMNIPAQUE IOHEXOL 300 MG/ML  SOLN   COMPARISON:  Chest x-ray 12/12/2014.  Chest CT 10/25/2014.  FINDINGS: Since prior CT, there is development of ground-glass opacities throughout the right upper lobe. Patchy numerous scattered bilateral ground-glass and nodular densities. Somewhat more confluent opacities in the lung bases bilaterally. Findings most compatible with infectious process/multifocal pneumonia. Cannot completely exclude atypical infection. Trace bilateral pleural effusions.  Heart is normal size. Aorta is normal caliber. No mediastinal, hilar, or axillary adenopathy. Chest wall soft tissues are unremarkable. Imaging into the upper abdomen shows no acute findings.  IMPRESSION: Ground-glass opacities in the right upper lobe with bilateral ground-glass and nodular densities throughout both lungs, somewhat more confluent in the lung bases. Findings most compatible with multifocal pneumonia.  Trace bilateral effusions.   Electronically Signed   By: Rolm Baptise M.D.   On: 12/12/2014 19:59    Scheduled Meds: . azithromycin  500 mg Intravenous Q24H  . cefTRIAXone (ROCEPHIN)  IV  1 g Intravenous Q24H  . heparin  5,000 Units Subcutaneous 3 times per day  . nicotine  21 mg Transdermal Daily  . omega-3 acid ethyl esters  1 g Oral Daily  . sodium chloride  3 mL Intravenous Q12H   Continuous Infusions:   Active Problems:   CAP (community acquired pneumonia)   Community acquired pneumonia    Time spent: 25 minutes. Greater than 50% of this time was spent in direct contact with the patient coordinating care.    Lelon Frohlich  [facility] Pager [phone]  If 7PM-7AM, please contact night-coverage at [email or web address], password [facility] 12/13/2014, 6:20 PM  LOS: 1 day

## 2014-12-14 LAB — CBC
HCT: 40.5 % (ref 39.0–52.0)
HEMOGLOBIN: 13.8 g/dL (ref 13.0–17.0)
MCH: 33.3 pg (ref 26.0–34.0)
MCHC: 34.1 g/dL (ref 30.0–36.0)
MCV: 97.6 fL (ref 78.0–100.0)
Platelets: 331 10*3/uL (ref 150–400)
RBC: 4.15 MIL/uL — ABNORMAL LOW (ref 4.22–5.81)
RDW: 12.3 % (ref 11.5–15.5)
WBC: 10.9 10*3/uL — ABNORMAL HIGH (ref 4.0–10.5)

## 2014-12-14 LAB — BASIC METABOLIC PANEL
Anion gap: 9 (ref 5–15)
BUN: 10 mg/dL (ref 6–23)
CHLORIDE: 97 mmol/L (ref 96–112)
CO2: 30 mmol/L (ref 19–32)
CREATININE: 0.63 mg/dL (ref 0.50–1.35)
Calcium: 8.6 mg/dL (ref 8.4–10.5)
GFR calc Af Amer: >90 mL/min (ref >90)
GFR calc non Af Amer: >90 mL/min (ref >90)
Glucose, Bld: 98 mg/dL (ref 70–99)
POTASSIUM: 4.1 mmol/L (ref 3.5–5.1)
Sodium: 136 mmol/L (ref 135–145)

## 2014-12-14 MED ORDER — ACETAMINOPHEN 325 MG PO TABS
650.0000 mg | ORAL_TABLET | Freq: Four times a day (QID) | ORAL | Status: DC | PRN
  Administered 2014-12-14: 650 mg via ORAL
  Filled 2014-12-14: qty 2

## 2014-12-14 MED ORDER — IBUPROFEN 800 MG PO TABS: 400.0000 mg | ORAL_TABLET | Freq: Four times a day (QID) | ORAL | Status: DC | PRN

## 2014-12-14 MED ORDER — CEFTRIAXONE SODIUM 1 G IJ SOLR
INTRAMUSCULAR | Status: AC
  Filled 2014-12-14: qty 10

## 2014-12-14 MED ORDER — DEXTROSE 5 % IV SOLN
INTRAVENOUS | Status: AC
  Filled 2014-12-14: qty 500

## 2014-12-14 NOTE — Progress Notes (Signed)
[facility] PROGRESS NOTE  RASHIDI LOH BHA:[id number] DOB: [birth year] DOA: 12/12/2014 PCP: Glo Herring., MD  Assessment/Plan: CAP -CT confirms multifocal PNA. -Agree with rocephin/azithro. -Cx data remains negative to date. -Afebrile/without leukocytosis. -Patient still feeling poorly. -Check flu PCR (negative).  Code Status: Full Code Family Communication: Patient only. Daughter Lilia Pro via phone. Disposition Plan: [facility] for DC home in 1 -2 days.   Consultants:  None   Antibiotics:  Rocephin  Azithro   Subjective: Still feels "wiped out"  Objective: Filed Vitals:   12/13/14 1230 12/13/14 2108 12/14/14 0537 12/14/14 1400  BP:  146/84 151/79 148/76  Pulse:  84 87 88  Temp:  98.6 F (37 C) 98 F (36.7 C) 98.1 F (36.7 C)  TempSrc:  Oral Oral   Resp:  19 20 20   Height:      Weight:      SpO2: 82% 96% 97% 96%    Intake/Output Summary (Last 24 hours) at 12/14/14 1734 Last data filed at 12/14/14 1500  Gross per 24 hour  Intake    603 ml  Output   1600 ml  Net   -997 ml   Filed Weights   12/12/14 1110  Weight: 90.719 kg (200 lb)    Exam:   General:  AA Ox3  Cardiovascular: RRR  Respiratory: CTA B  Abdomen: S/NT/ND/+BS  Extremities: no C/C/E   Neurologic:  Intact/non-focal  Data Reviewed: Basic Metabolic Panel:  Recent Labs Lab 12/12/14 1229 12/13/14 0639 12/14/14 0644  NA 132* 136 136  K 4.2 4.5 4.1  CL 93* 95* 97  CO2 32 32 30  GLUCOSE 102* 148* 98  BUN 6 9 10   CREATININE 0.61 0.63 0.63  CALCIUM 8.3* 8.9 8.6   Liver Function Tests:  Recent Labs Lab 12/12/14 1229 12/13/14 0639  AST 69* 51*  ALT 54* 51  ALKPHOS 63 59  BILITOT 0.8 0.7  PROT 6.6 6.8  ALBUMIN 3.2* 3.1*   No results for input(s): LIPASE, AMYLASE in the last 168 hours.  Recent Labs Lab 12/12/14 1229  AMMONIA <9*   CBC:  Recent Labs Lab 12/12/14 1229 12/13/14 0639 12/14/14 0644  WBC 7.4 8.1 10.9*  NEUTROABS 5.1  --   --     HGB 13.6 14.4 13.8  HCT 38.7* 40.5 40.5  MCV 96.8 96.9 97.6  PLT 204 260 331   Cardiac Enzymes:  Recent Labs Lab 12/12/14 1229  TROPONINI <0.03   BNP (last 3 results) No results for input(s): BNP in the last 8760 hours.  ProBNP (last 3 results) No results for input(s): PROBNP in the last 8760 hours.  CBG:  Recent Labs Lab 12/13/14 1633  GLUCAP 141*    Recent Results (from the past 240 hour(s))  Blood culture (routine x 2)     Status: None (Preliminary result)   Collection Time: 12/12/14  3:12 PM  Result Value Ref Range Status   Specimen Description BLOOD RIGHT HAND  Final   Special Requests BOTTLES DRAWN AEROBIC AND ANAEROBIC 6CC  Final   Culture NO GROWTH 2 DAYS  Final   Report Status PENDING  Incomplete  Blood culture (routine x 2)     Status: None (Preliminary result)   Collection Time: 12/12/14  3:15 PM  Result Value Ref Range Status   Specimen Description BLOOD LEFT HAND  Final   Special Requests BOTTLES DRAWN AEROBIC AND ANAEROBIC 6CC  Final   Culture NO GROWTH 2 DAYS  Final   Report Status PENDING  Incomplete     Studies: Ct Chest W Contrast  12/12/2014   CLINICAL DATA:  Community-acquired pneumonia.  EXAM: CT CHEST WITH CONTRAST  TECHNIQUE: Multidetector CT imaging of the chest was performed during intravenous contrast administration.  CONTRAST:  39mL OMNIPAQUE IOHEXOL 300 MG/ML  SOLN  COMPARISON:  Chest x-ray 12/12/2014.  Chest CT 10/25/2014.  FINDINGS: Since prior CT, there is development of ground-glass opacities throughout the right upper lobe. Patchy numerous scattered bilateral ground-glass and nodular densities. Somewhat more confluent opacities in the lung bases bilaterally. Findings most compatible with infectious process/multifocal pneumonia. Cannot completely exclude atypical infection. Trace bilateral pleural effusions.  Heart is normal size. Aorta is normal caliber. No mediastinal, hilar, or axillary adenopathy. Chest wall soft tissues are  unremarkable. Imaging into the upper abdomen shows no acute findings.  IMPRESSION: Ground-glass opacities in the right upper lobe with bilateral ground-glass and nodular densities throughout both lungs, somewhat more confluent in the lung bases. Findings most compatible with multifocal pneumonia.  Trace bilateral effusions.   Electronically Signed   By: Rolm Baptise M.D.   On: 12/12/2014 19:59    Scheduled Meds: . azithromycin  500 mg Intravenous Q24H  . cefTRIAXone (ROCEPHIN)  IV  1 g Intravenous Q24H  . heparin  5,000 Units Subcutaneous 3 times per day  . nicotine  21 mg Transdermal Daily  . omega-3 acid ethyl esters  1 g Oral Daily  . sodium chloride  3 mL Intravenous Q12H   Continuous Infusions:   Active Problems:   CAP (community acquired pneumonia)   Community acquired pneumonia    Time spent: 25 minutes. Greater than 50% of this time was spent in direct contact with the patient coordinating care.    Lelon Frohlich  [facility] Pager [phone]  If 7PM-7AM, please contact night-coverage at [email or web address], password [facility] 12/14/2014, 5:34 PM  LOS: 2 days

## 2014-12-15 LAB — LEGIONELLA ANTIGEN, URINE

## 2014-12-15 LAB — STREP PNEUMONIAE URINARY ANTIGEN: Strep Pneumo Urinary Antigen: NEGATIVE

## 2014-12-15 MED ORDER — AMOXICILLIN-POT CLAVULANATE 875-125 MG PO TABS: 1.0000 | ORAL_TABLET | Freq: Two times a day (BID) | ORAL | Status: DC

## 2014-12-15 NOTE — Discharge Summary (Signed)
Physician Discharge Summary  EDKER PUNT YME:[id number] DOB: 1951-08-14 DOA: 12/12/2014  PCP: Glo Herring., MD  Admit date: 12/12/2014 Discharge date: 12/15/2014  Time spent: 45 minutes  Recommendations for Outpatient Follow-up:  -Will be discharged home today. -Advised to follow up with PCP in 2 weeks. -Would recommend repeat CXR in 4-6 weeks to ensure complete resolution of PNA.   Discharge Diagnoses:  Active Problems:   CAP (community acquired pneumonia)   Community acquired pneumonia   Discharge Condition: Stable and improved  Filed Weights   12/12/14 1110  Weight: 90.719 kg (200 lb)    History of present illness:  This is a 64 year old man, smoker, who presents with gradual onset of persistent productive cough for the last week or so. The cough was productive of yellow sputum. He has been also getting more short of breath with exertion for the last few days. He feels weak. He was in the emergency room a few days ago and was treated with antibiotics by injection and sent home with oral antibiotics. He has now not improved and he comes back. He denies any hemoptysis, weight loss, chest pain, nausea or vomiting. When he was seen in emergency room a couple days ago, he was diagnosed with left-sided pneumonia. Evaluation in the emergency room today shows that the left-sided pneumonia symptoms to have improved some degree but he now has a new infiltrate in the right lung. He is now being admitted for further management. Of note, he spent 1 week in [place] in the month of January.  Hospital Course:   CAP -CT confirms multifocal PNA. -Transitioned to augmentin on DC for 6 more days. -Cx data remains negative to date. -Afebrile/without leukocytosis. -Flu PCR  Negative.  Tobacco Abuse -Counseled on cessation. -Nicotine patch provided on arrival.  Procedures:  None   Consultations:  None  Discharge Instructions  Discharge Instructions    Increase activity  slowly    Complete by:  As directed             Medication List    STOP taking these medications        cefdinir 300 MG capsule  Commonly known as:  OMNICEF      TAKE these medications        albuterol 108 (90 BASE) MCG/ACT inhaler  Commonly known as:  PROVENTIL HFA;VENTOLIN HFA  Inhale 2 puffs into the lungs every 4 (four) hours as needed for wheezing or shortness of breath.     amoxicillin-clavulanate 875-125 MG per tablet  Commonly known as:  AUGMENTIN  Take 1 tablet by mouth 2 (two) times daily.     COLCRYS 0.6 MG tablet  Generic drug:  colchicine  Take 0.6 mg by mouth daily as needed (gout flare up).     Fish Oil 1000 MG Caps  Take 1,000 mg by mouth daily.     HYDROcodone-homatropine 5-1.5 MG/5ML syrup  Commonly known as:  HYCODAN  Take 5 mLs by mouth every 6 (six) hours as needed for cough.     SUPER MULTI-VITAMIN PO  Take 1 Package by mouth daily.       Allergies  Allergen Reactions  . Nsaids Other (See Comments)    Patient states he "just isn't suppose to take them"       Follow-up Information    Follow up with Glo Herring., MD. Schedule an appointment as soon as possible for a visit in 2 weeks.   Specialty:  Internal Medicine   Contact information:  [address] [place] [address] [phone]        The results of significant diagnostics from this hospitalization (including imaging, microbiology, ancillary and laboratory) are listed below for reference.    Significant Diagnostic Studies: Dg Chest 2 View  12/12/2014   CLINICAL DATA:  Recent pneumonia with persistent cough  EXAM: CHEST  2 VIEW  COMPARISON:  December 07, 2014  FINDINGS: There is partial clearing of infiltrate from the left base. However, there is new infiltrate in the anterior segment of the right upper lobe adjacent to the minor fissure. Lungs elsewhere clear. Heart size and pulmonary vascularity are normal. No adenopathy. No bone lesions.  IMPRESSION: New  infiltrate anterior segment right upper lobe adjacent to the minor fissure. Partial clearing left base infiltrate. Lungs elsewhere clear. No change in cardiac silhouette.   Electronically Signed   By: Lowella Grip III M.D.   On: 12/12/2014 14:07   Dg Chest 2 View  12/07/2014   CLINICAL DATA:  Productive cough for 2 days.  Smoking history.  EXAM: CHEST  2 VIEW  COMPARISON:  Chest CT 10/25/2014  FINDINGS: The cardiac silhouette, mediastinal and hilar contours are within normal limits and stable. Asymmetric streaky airspace opacity in the left lower lobe suspicious for bronchopneumonia. No pleural effusion. No pulmonary lesions.  IMPRESSION: Suspect left lower lobe bronchopneumonia   Electronically Signed   By: Marijo Sanes M.D.   On: 12/07/2014 13:49   Ct Chest W Contrast  12/12/2014   CLINICAL DATA:  Community-acquired pneumonia.  EXAM: CT CHEST WITH CONTRAST  TECHNIQUE: Multidetector CT imaging of the chest was performed during intravenous contrast administration.  CONTRAST:  71mL OMNIPAQUE IOHEXOL 300 MG/ML  SOLN  COMPARISON:  Chest x-ray 12/12/2014.  Chest CT 10/25/2014.  FINDINGS: Since prior CT, there is development of ground-glass opacities throughout the right upper lobe. Patchy numerous scattered bilateral ground-glass and nodular densities. Somewhat more confluent opacities in the lung bases bilaterally. Findings most compatible with infectious process/multifocal pneumonia. Cannot completely exclude atypical infection. Trace bilateral pleural effusions.  Heart is normal size. Aorta is normal caliber. No mediastinal, hilar, or axillary adenopathy. Chest wall soft tissues are unremarkable. Imaging into the upper abdomen shows no acute findings.  IMPRESSION: Ground-glass opacities in the right upper lobe with bilateral ground-glass and nodular densities throughout both lungs, somewhat more confluent in the lung bases. Findings most compatible with multifocal pneumonia.  Trace bilateral effusions.    Electronically Signed   By: Rolm Baptise M.D.   On: 12/12/2014 19:59    Microbiology: Recent Results (from the past 240 hour(s))  Blood culture (routine x 2)     Status: None (Preliminary result)   Collection Time: 12/12/14  3:12 PM  Result Value Ref Range Status   Specimen Description BLOOD RIGHT HAND  Final   Special Requests BOTTLES DRAWN AEROBIC AND ANAEROBIC 6CC  Final   Culture NO GROWTH 3 DAYS  Final   Report Status PENDING  Incomplete  Blood culture (routine x 2)     Status: None (Preliminary result)   Collection Time: 12/12/14  3:15 PM  Result Value Ref Range Status   Specimen Description BLOOD LEFT HAND  Final   Special Requests BOTTLES DRAWN AEROBIC AND ANAEROBIC 6CC  Final   Culture NO GROWTH 3 DAYS  Final   Report Status PENDING  Incomplete     Labs: Basic Metabolic Panel:  Recent Labs Lab 12/12/14 1229 12/13/14 0639 12/14/14 0644  NA 132* 136 136  K 4.2  4.5 4.1  CL 93* 95* 97  CO2 32 32 30  GLUCOSE 102* 148* 98  BUN 6 9 10   CREATININE 0.61 0.63 0.63  CALCIUM 8.3* 8.9 8.6   Liver Function Tests:  Recent Labs Lab 12/12/14 1229 12/13/14 0639  AST 69* 51*  ALT 54* 51  ALKPHOS 63 59  BILITOT 0.8 0.7  PROT 6.6 6.8  ALBUMIN 3.2* 3.1*   No results for input(s): LIPASE, AMYLASE in the last 168 hours.  Recent Labs Lab 12/12/14 1229  AMMONIA <9*   CBC:  Recent Labs Lab 12/12/14 1229 12/13/14 0639 12/14/14 0644  WBC 7.4 8.1 10.9*  NEUTROABS 5.1  --   --   HGB 13.6 14.4 13.8  HCT 38.7* 40.5 40.5  MCV 96.8 96.9 97.6  PLT 204 260 331   Cardiac Enzymes:  Recent Labs Lab 12/12/14 1229  TROPONINI <0.03   BNP: BNP (last 3 results) No results for input(s): BNP in the last 8760 hours.  ProBNP (last 3 results) No results for input(s): PROBNP in the last 8760 hours.  CBG:  Recent Labs Lab 12/13/14 1633  GLUCAP 141*       Signed:  Lelon Frohlich  [facility] Pager: [phone] 12/15/2014, 2:42 PM

## 2014-12-15 NOTE — Care Management Note (Signed)
    Page 1 of 1   12/15/2014     1:02:25 PM CARE MANAGEMENT NOTE 12/15/2014  Patient:  Cesar Gonzalez, Cesar Gonzalez   Account Number:  [id number]  Date Initiated:  12/15/2014  Documentation initiated by:  Theophilus Kinds  Subjective/Objective Assessment:   Pt admitted from home with pneumonia. Pt lives with his wife and will return home at discharge. Pt is independent with ADl's.     Action/Plan:   No Cm needs noted. Pt discharged home today.   Anticipated DC Date:  12/15/2014   Anticipated DC Plan:  [place]  CM consult      Choice offered to / List presented to:             Status of service:  Completed, signed off Medicare Important Message given?   (If response is "NO", the following Medicare IM given date fields will be blank) Date Medicare IM given:   Medicare IM given by:   Date Additional Medicare IM given:   Additional Medicare IM given by:    Discharge Disposition:  HOME/SELF CARE  Per UR Regulation:    If discussed at Long Length of Stay Meetings, dates discussed:    Comments:  12/15/14 Bessie, RN BSN CM

## 2014-12-15 NOTE — Progress Notes (Signed)
Patient discharged with instructions, prescription, and care notes.  Verbalized understanding via teach back.  IV was removed and the site was WNL. Patient voiced no further complaints or concerns at the time of discharge.  Appointments scheduled per instructions.  I attempted to make the appointment, but the office stated that we would need to fax the d/c summary prior to making the appointment.  The receptionist took the patients information and stated that they would call him for the appointment.  Voiced this to the patient and he verbalized understanding.  Patient left the floor via w/c with staff and family in stable condition.

## 2014-12-15 NOTE — Progress Notes (Signed)
UR chart review completed.  

## 2014-12-17 LAB — CULTURE, BLOOD (ROUTINE X 2)
CULTURE: NO GROWTH
Culture: NO GROWTH

## 2014-12-18 NOTE — Progress Notes (Signed)
UR chart review completed.  

## 2015-06-02 ENCOUNTER — Other Ambulatory Visit (HOSPITAL_COMMUNITY): Payer: Self-pay | Admitting: Family Medicine

## 2015-06-02 ENCOUNTER — Ambulatory Visit (HOSPITAL_COMMUNITY)
Admission: RE | Admit: 2015-06-02 | Discharge: 2015-06-02 | Disposition: A | Discharge status: Home / Self Care | Payer: PRIVATE HEALTH INSURANCE | Source: Ambulatory Visit | Attending: Family Medicine | Admitting: Family Medicine

## 2015-06-02 DIAGNOSIS — R945 Abnormal results of liver function studies: Secondary | ICD-10-CM

## 2015-06-02 DIAGNOSIS — R0789 Other chest pain: Secondary | ICD-10-CM | POA: Diagnosis present

## 2015-06-02 DIAGNOSIS — R739 Hyperglycemia, unspecified: Secondary | ICD-10-CM

## 2016-04-24 IMAGING — DX DG CHEST 2V
2 series · 2 of 2 positions shown · non-contrast
Comparison: Chest CT 10/25/2014

CLINICAL DATA: Productive cough for 2 days.  Smoking history.

EXAM:
CHEST  2 VIEW

[chest pa]
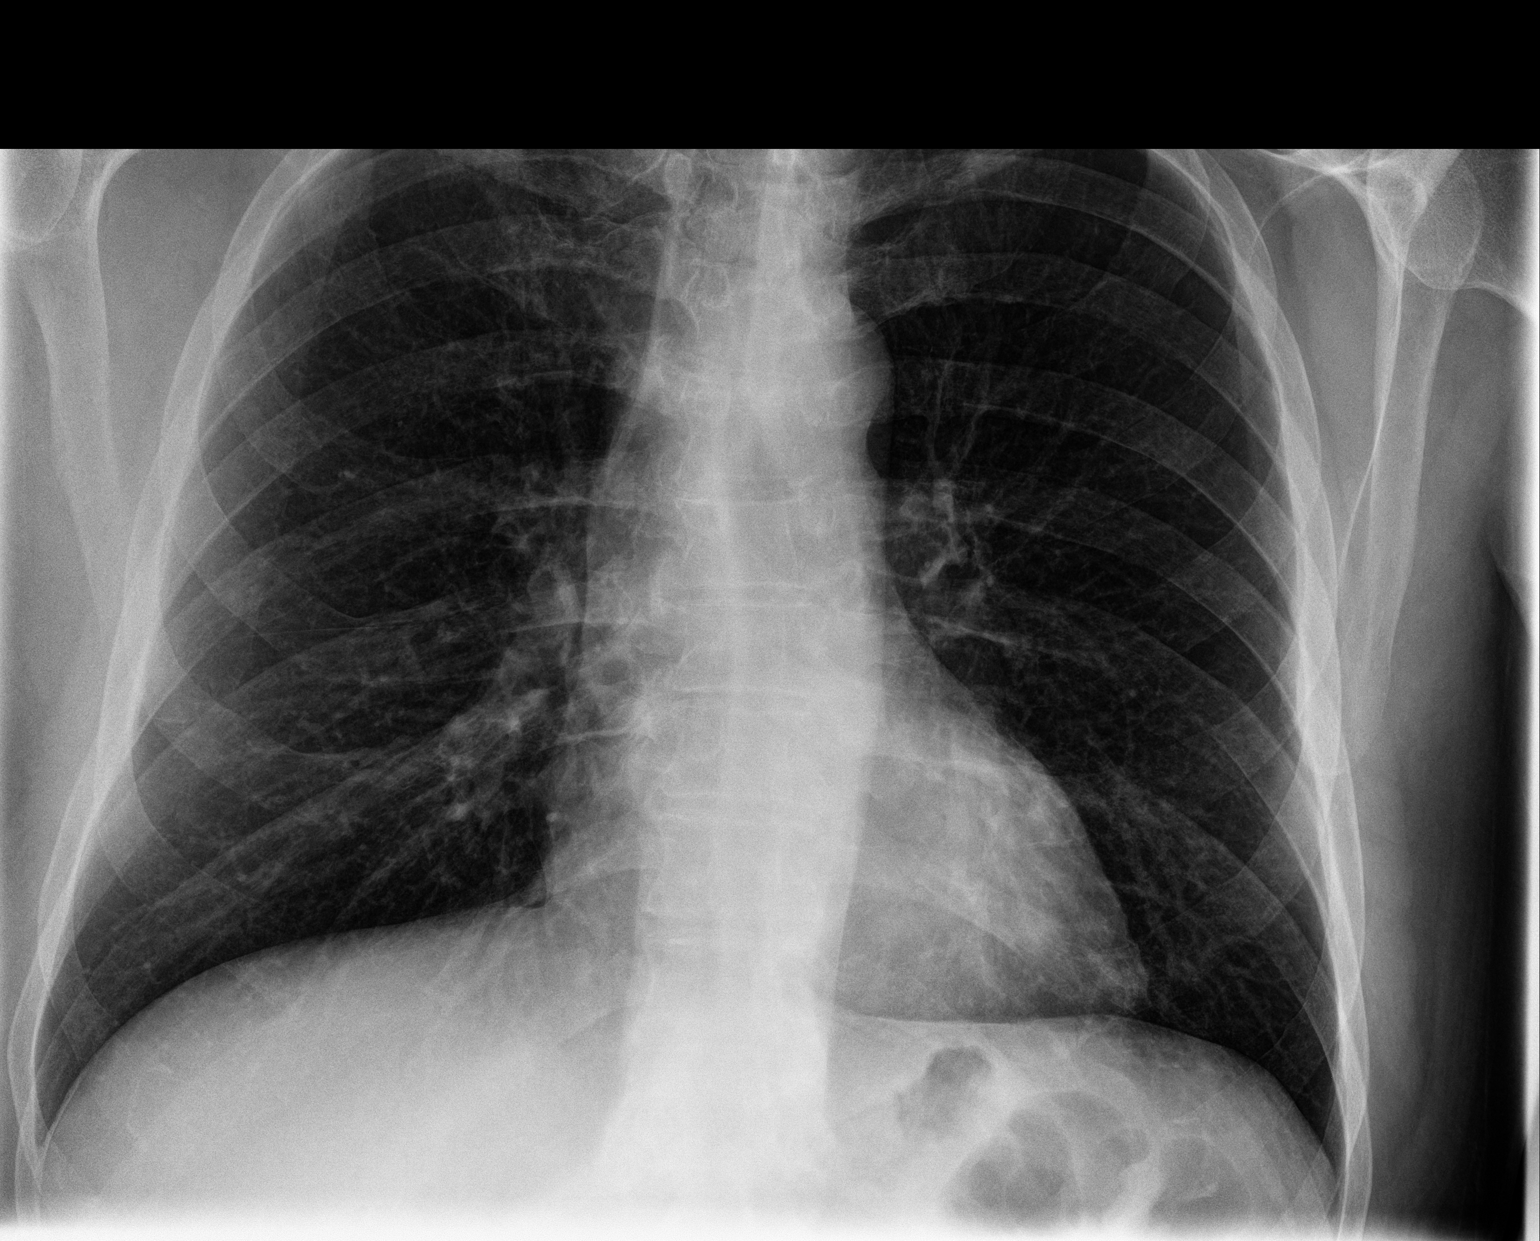

[chest lat]
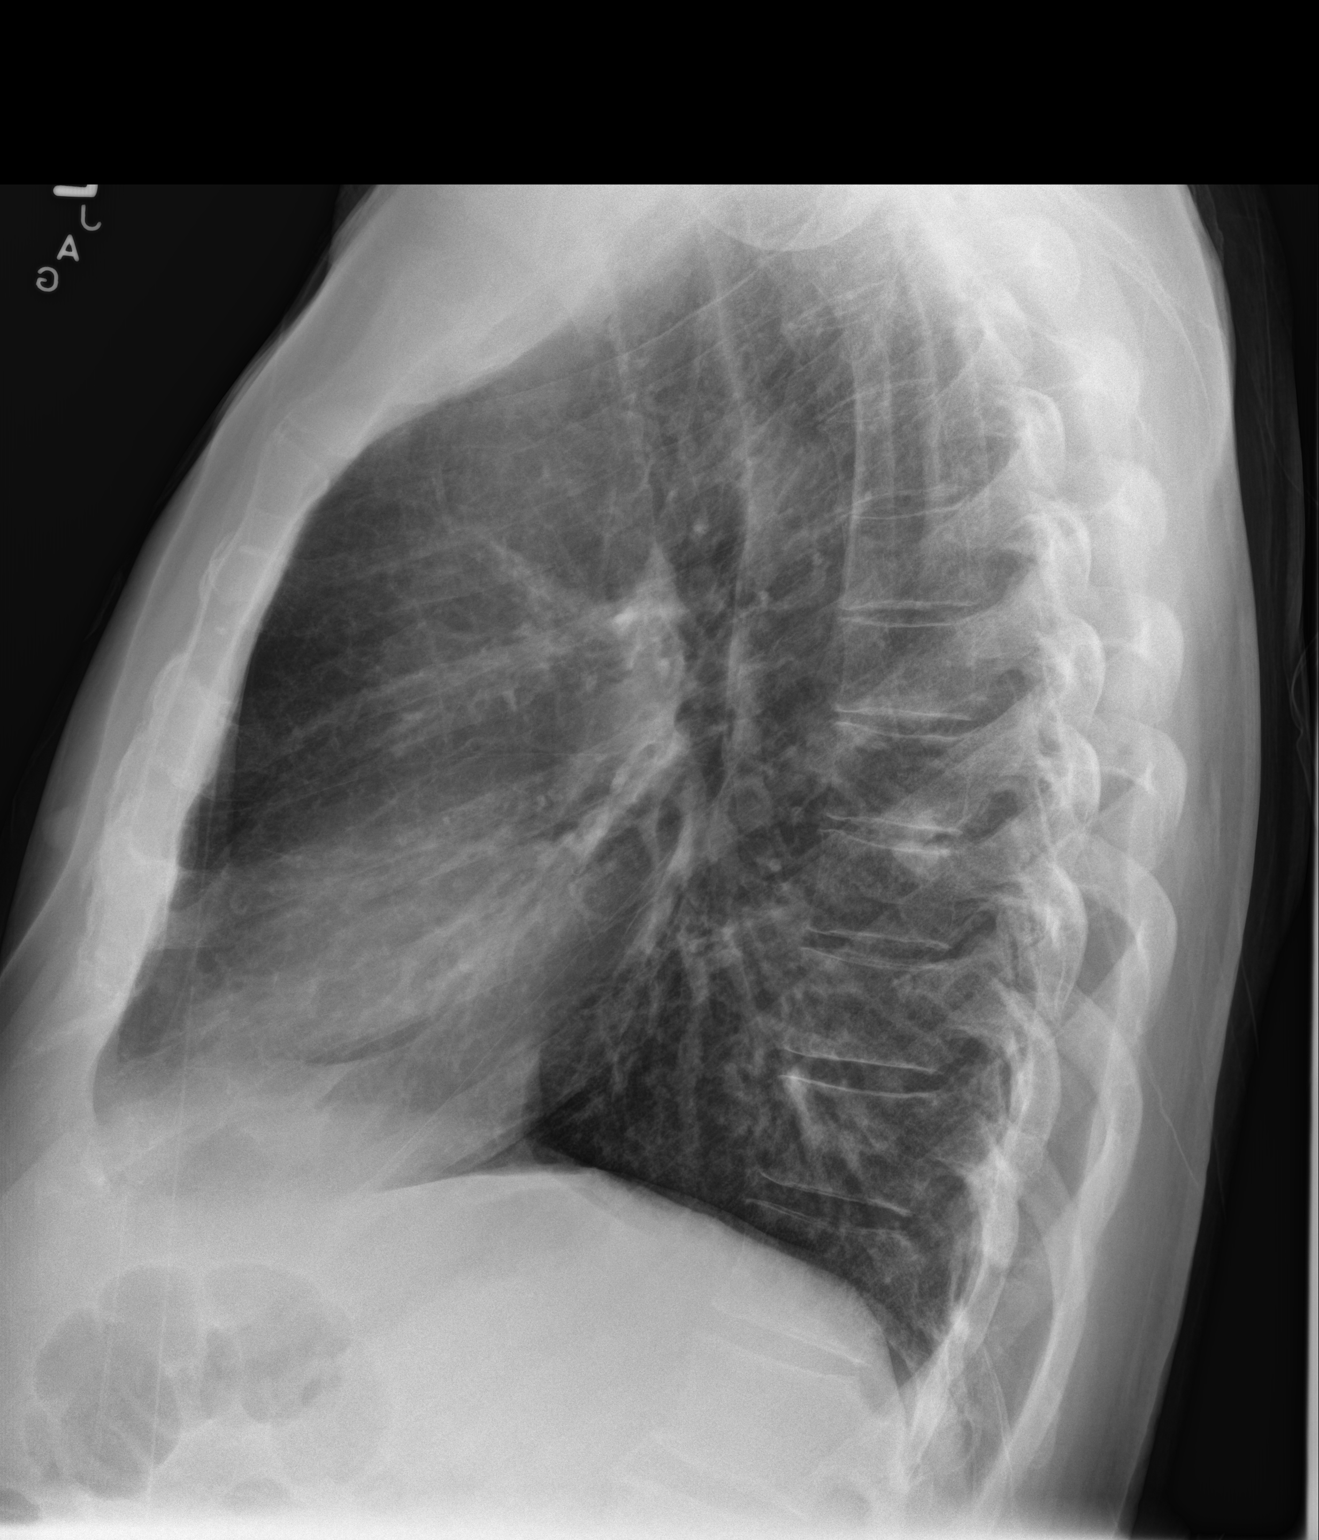

[2 of 2 positions shown; findings below may reference images not displayed]

FINDINGS: The cardiac silhouette, mediastinal and hilar contours are within
normal limits and stable. Asymmetric streaky airspace opacity in the
left lower lobe suspicious for bronchopneumonia. No pleural
effusion. No pulmonary lesions.
IMPRESSION: Suspect left lower lobe bronchopneumonia

## 2016-07-05 ENCOUNTER — Encounter (INDEPENDENT_AMBULATORY_CARE_PROVIDER_SITE_OTHER): Payer: Self-pay | Admitting: *Deleted

## 2017-05-25 DIAGNOSIS — M1000 Idiopathic gout, unspecified site: Secondary | ICD-10-CM | POA: Diagnosis not present

## 2017-05-25 DIAGNOSIS — E782 Mixed hyperlipidemia: Secondary | ICD-10-CM | POA: Diagnosis not present

## 2017-05-25 DIAGNOSIS — Z1389 Encounter for screening for other disorder: Secondary | ICD-10-CM | POA: Diagnosis not present

## 2017-06-01 DIAGNOSIS — G894 Chronic pain syndrome: Secondary | ICD-10-CM | POA: Diagnosis not present

## 2017-06-01 DIAGNOSIS — R7309 Other abnormal glucose: Secondary | ICD-10-CM | POA: Diagnosis not present

## 2017-06-01 DIAGNOSIS — M1A00X0 Idiopathic chronic gout, unspecified site, without tophus (tophi): Secondary | ICD-10-CM | POA: Diagnosis not present

## 2017-06-01 DIAGNOSIS — Z1389 Encounter for screening for other disorder: Secondary | ICD-10-CM | POA: Diagnosis not present

## 2017-08-04 DIAGNOSIS — R7309 Other abnormal glucose: Secondary | ICD-10-CM | POA: Diagnosis not present

## 2017-08-04 DIAGNOSIS — J069 Acute upper respiratory infection, unspecified: Secondary | ICD-10-CM | POA: Diagnosis not present

## 2017-08-04 DIAGNOSIS — Z1389 Encounter for screening for other disorder: Secondary | ICD-10-CM | POA: Diagnosis not present

## 2017-08-04 DIAGNOSIS — J209 Acute bronchitis, unspecified: Secondary | ICD-10-CM | POA: Diagnosis not present

## 2017-08-04 DIAGNOSIS — Z719 Counseling, unspecified: Secondary | ICD-10-CM | POA: Diagnosis not present

## 2017-08-24 DIAGNOSIS — Z1389 Encounter for screening for other disorder: Secondary | ICD-10-CM | POA: Diagnosis not present

## 2017-08-24 DIAGNOSIS — Z125 Encounter for screening for malignant neoplasm of prostate: Secondary | ICD-10-CM | POA: Diagnosis not present

## 2017-08-28 DIAGNOSIS — G894 Chronic pain syndrome: Secondary | ICD-10-CM | POA: Diagnosis not present

## 2017-08-28 DIAGNOSIS — Z1389 Encounter for screening for other disorder: Secondary | ICD-10-CM | POA: Diagnosis not present

## 2017-08-28 DIAGNOSIS — Z23 Encounter for immunization: Secondary | ICD-10-CM | POA: Diagnosis not present

## 2017-08-28 DIAGNOSIS — L309 Dermatitis, unspecified: Secondary | ICD-10-CM | POA: Diagnosis not present

## 2017-10-30 DIAGNOSIS — J343 Hypertrophy of nasal turbinates: Secondary | ICD-10-CM | POA: Diagnosis not present

## 2017-10-30 DIAGNOSIS — R0602 Shortness of breath: Secondary | ICD-10-CM | POA: Diagnosis not present

## 2017-10-30 DIAGNOSIS — R05 Cough: Secondary | ICD-10-CM | POA: Diagnosis not present

## 2017-11-15 DIAGNOSIS — J069 Acute upper respiratory infection, unspecified: Secondary | ICD-10-CM | POA: Diagnosis not present

## 2017-11-15 DIAGNOSIS — M1A00X0 Idiopathic chronic gout, unspecified site, without tophus (tophi): Secondary | ICD-10-CM | POA: Diagnosis not present

## 2017-11-30 ENCOUNTER — Ambulatory Visit (HOSPITAL_COMMUNITY)
Admission: RE | Admit: 2017-11-30 | Discharge: 2017-11-30 | Disposition: A | Discharge status: Home / Self Care | Payer: Medicare Other | Source: Ambulatory Visit | Attending: Family Medicine | Admitting: Family Medicine

## 2017-11-30 ENCOUNTER — Other Ambulatory Visit (HOSPITAL_COMMUNITY): Payer: Self-pay | Admitting: Family Medicine

## 2017-11-30 DIAGNOSIS — I700 Atherosclerosis of aorta: Secondary | ICD-10-CM | POA: Insufficient documentation

## 2017-11-30 DIAGNOSIS — J069 Acute upper respiratory infection, unspecified: Secondary | ICD-10-CM

## 2017-11-30 DIAGNOSIS — R05 Cough: Secondary | ICD-10-CM | POA: Diagnosis not present

## 2017-11-30 DIAGNOSIS — M1A00X0 Idiopathic chronic gout, unspecified site, without tophus (tophi): Secondary | ICD-10-CM | POA: Insufficient documentation

## 2017-11-30 DIAGNOSIS — F410 Panic disorder [episodic paroxysmal anxiety] without agoraphobia: Secondary | ICD-10-CM | POA: Diagnosis not present

## 2017-11-30 DIAGNOSIS — F17200 Nicotine dependence, unspecified, uncomplicated: Secondary | ICD-10-CM | POA: Diagnosis not present

## 2017-11-30 DIAGNOSIS — J449 Chronic obstructive pulmonary disease, unspecified: Secondary | ICD-10-CM | POA: Diagnosis not present

## 2017-12-07 DIAGNOSIS — Z1389 Encounter for screening for other disorder: Secondary | ICD-10-CM | POA: Diagnosis not present

## 2017-12-07 DIAGNOSIS — M1A00X0 Idiopathic chronic gout, unspecified site, without tophus (tophi): Secondary | ICD-10-CM | POA: Diagnosis not present

## 2017-12-07 DIAGNOSIS — J449 Chronic obstructive pulmonary disease, unspecified: Secondary | ICD-10-CM | POA: Diagnosis not present

## 2018-02-05 DIAGNOSIS — G894 Chronic pain syndrome: Secondary | ICD-10-CM | POA: Diagnosis not present

## 2018-02-05 DIAGNOSIS — Z1389 Encounter for screening for other disorder: Secondary | ICD-10-CM | POA: Diagnosis not present

## 2018-02-05 DIAGNOSIS — L309 Dermatitis, unspecified: Secondary | ICD-10-CM | POA: Diagnosis not present

## 2018-02-05 DIAGNOSIS — Z23 Encounter for immunization: Secondary | ICD-10-CM | POA: Diagnosis not present

## 2018-02-05 DIAGNOSIS — Z719 Counseling, unspecified: Secondary | ICD-10-CM | POA: Diagnosis not present

## 2018-04-12 DIAGNOSIS — H40013 Open angle with borderline findings, low risk, bilateral: Secondary | ICD-10-CM | POA: Diagnosis not present

## 2018-04-12 DIAGNOSIS — H2513 Age-related nuclear cataract, bilateral: Secondary | ICD-10-CM | POA: Diagnosis not present

## 2018-04-12 DIAGNOSIS — H25013 Cortical age-related cataract, bilateral: Secondary | ICD-10-CM | POA: Diagnosis not present

## 2018-04-12 DIAGNOSIS — H43393 Other vitreous opacities, bilateral: Secondary | ICD-10-CM | POA: Diagnosis not present

## 2018-04-12 DIAGNOSIS — H2512 Age-related nuclear cataract, left eye: Secondary | ICD-10-CM | POA: Diagnosis not present

## 2018-05-01 DIAGNOSIS — H25812 Combined forms of age-related cataract, left eye: Secondary | ICD-10-CM | POA: Diagnosis not present

## 2018-05-01 DIAGNOSIS — H2512 Age-related nuclear cataract, left eye: Secondary | ICD-10-CM | POA: Diagnosis not present

## 2018-05-03 DIAGNOSIS — Z0001 Encounter for general adult medical examination with abnormal findings: Secondary | ICD-10-CM | POA: Diagnosis not present

## 2018-05-03 DIAGNOSIS — G894 Chronic pain syndrome: Secondary | ICD-10-CM | POA: Diagnosis not present

## 2018-05-21 DIAGNOSIS — H2511 Age-related nuclear cataract, right eye: Secondary | ICD-10-CM | POA: Diagnosis not present

## 2018-05-21 DIAGNOSIS — H25011 Cortical age-related cataract, right eye: Secondary | ICD-10-CM | POA: Diagnosis not present

## 2018-05-29 DIAGNOSIS — H25811 Combined forms of age-related cataract, right eye: Secondary | ICD-10-CM | POA: Diagnosis not present

## 2018-05-29 DIAGNOSIS — H2511 Age-related nuclear cataract, right eye: Secondary | ICD-10-CM | POA: Diagnosis not present

## 2018-06-14 DIAGNOSIS — J019 Acute sinusitis, unspecified: Secondary | ICD-10-CM | POA: Diagnosis not present

## 2018-06-14 DIAGNOSIS — J301 Allergic rhinitis due to pollen: Secondary | ICD-10-CM | POA: Diagnosis not present

## 2018-07-24 DIAGNOSIS — R7309 Other abnormal glucose: Secondary | ICD-10-CM | POA: Diagnosis not present

## 2018-07-24 DIAGNOSIS — L309 Dermatitis, unspecified: Secondary | ICD-10-CM | POA: Diagnosis not present

## 2018-07-24 DIAGNOSIS — G894 Chronic pain syndrome: Secondary | ICD-10-CM | POA: Diagnosis not present

## 2018-09-12 DIAGNOSIS — J22 Unspecified acute lower respiratory infection: Secondary | ICD-10-CM | POA: Diagnosis not present

## 2018-09-12 DIAGNOSIS — J209 Acute bronchitis, unspecified: Secondary | ICD-10-CM | POA: Diagnosis not present

## 2018-09-15 ENCOUNTER — Encounter (HOSPITAL_COMMUNITY): Payer: Self-pay | Admitting: Emergency Medicine

## 2018-09-15 ENCOUNTER — Other Ambulatory Visit: Payer: Self-pay

## 2018-09-15 ENCOUNTER — Emergency Department (HOSPITAL_COMMUNITY): Payer: Medicare Other

## 2018-09-15 ENCOUNTER — Inpatient Hospital Stay (HOSPITAL_COMMUNITY)
Admission: EM | Admit: 2018-09-15 | Discharge: 2018-09-18 | DRG: 871 | Disposition: A | Discharge status: Home / Self Care | Payer: Medicare Other | Attending: Family Medicine | Admitting: Family Medicine

## 2018-09-15 DIAGNOSIS — J9601 Acute respiratory failure with hypoxia: Secondary | ICD-10-CM | POA: Diagnosis not present

## 2018-09-15 DIAGNOSIS — J440 Chronic obstructive pulmonary disease with acute lower respiratory infection: Secondary | ICD-10-CM | POA: Diagnosis present

## 2018-09-15 DIAGNOSIS — Z79899 Other long term (current) drug therapy: Secondary | ICD-10-CM | POA: Diagnosis not present

## 2018-09-15 DIAGNOSIS — Z886 Allergy status to analgesic agent status: Secondary | ICD-10-CM

## 2018-09-15 DIAGNOSIS — J449 Chronic obstructive pulmonary disease, unspecified: Secondary | ICD-10-CM | POA: Diagnosis not present

## 2018-09-15 DIAGNOSIS — A419 Sepsis, unspecified organism: Principal | ICD-10-CM | POA: Diagnosis present

## 2018-09-15 DIAGNOSIS — R000 Tachycardia, unspecified: Secondary | ICD-10-CM | POA: Diagnosis not present

## 2018-09-15 DIAGNOSIS — J181 Lobar pneumonia, unspecified organism: Secondary | ICD-10-CM

## 2018-09-15 DIAGNOSIS — Z8042 Family history of malignant neoplasm of prostate: Secondary | ICD-10-CM | POA: Diagnosis not present

## 2018-09-15 DIAGNOSIS — R6520 Severe sepsis without septic shock: Secondary | ICD-10-CM | POA: Diagnosis not present

## 2018-09-15 DIAGNOSIS — J441 Chronic obstructive pulmonary disease with (acute) exacerbation: Secondary | ICD-10-CM | POA: Diagnosis present

## 2018-09-15 DIAGNOSIS — Z720 Tobacco use: Secondary | ICD-10-CM | POA: Diagnosis present

## 2018-09-15 DIAGNOSIS — Z833 Family history of diabetes mellitus: Secondary | ICD-10-CM

## 2018-09-15 DIAGNOSIS — F17210 Nicotine dependence, cigarettes, uncomplicated: Secondary | ICD-10-CM | POA: Diagnosis present

## 2018-09-15 DIAGNOSIS — Z825 Family history of asthma and other chronic lower respiratory diseases: Secondary | ICD-10-CM

## 2018-09-15 DIAGNOSIS — M109 Gout, unspecified: Secondary | ICD-10-CM | POA: Diagnosis not present

## 2018-09-15 DIAGNOSIS — J189 Pneumonia, unspecified organism: Secondary | ICD-10-CM | POA: Diagnosis present

## 2018-09-15 DIAGNOSIS — R918 Other nonspecific abnormal finding of lung field: Secondary | ICD-10-CM | POA: Diagnosis not present

## 2018-09-15 HISTORY — DX: Tobacco use: Z72.0

## 2018-09-15 HISTORY — DX: Chronic obstructive pulmonary disease, unspecified: J44.9

## 2018-09-15 LAB — COMPREHENSIVE METABOLIC PANEL
ALK PHOS: 81 U/L (ref 38–126)
ALT: 36 U/L (ref 0–44)
ANION GAP: 10 (ref 5–15)
AST: 41 U/L (ref 15–41)
Albumin: 3.9 g/dL (ref 3.5–5.0)
BUN: 22 mg/dL (ref 8–23)
CO2: 29 mmol/L (ref 22–32)
Calcium: 8.7 mg/dL — ABNORMAL LOW (ref 8.9–10.3)
Chloride: 96 mmol/L — ABNORMAL LOW (ref 98–111)
Creatinine, Ser: 0.9 mg/dL (ref 0.61–1.24)
GFR calc Af Amer: >60 mL/min (ref >60)
GFR calc non Af Amer: >60 mL/min (ref >60)
Glucose, Bld: 191 mg/dL — ABNORMAL HIGH (ref 70–99)
Potassium: 4.3 mmol/L (ref 3.5–5.1)
SODIUM: 135 mmol/L (ref 135–145)
Total Bilirubin: 1.5 mg/dL — ABNORMAL HIGH (ref 0.3–1.2)
Total Protein: 8.7 g/dL — ABNORMAL HIGH (ref 6.5–8.1)

## 2018-09-15 LAB — URINALYSIS, ROUTINE W REFLEX MICROSCOPIC
BACTERIA UA: NONE SEEN
Glucose, UA: NEGATIVE mg/dL
Hgb urine dipstick: NEGATIVE
KETONES UR: NEGATIVE mg/dL
Leukocytes, UA: NEGATIVE
Nitrite: NEGATIVE
PH: 5 (ref 5.0–8.0)
Protein, ur: 100 mg/dL — AB
Specific Gravity, Urine: 1.025 (ref 1.005–1.030)

## 2018-09-15 LAB — CBC WITH DIFFERENTIAL/PLATELET
Abs Immature Granulocytes: 0.18 10*3/uL — ABNORMAL HIGH (ref 0.00–0.07)
Basophils Absolute: 0.1 10*3/uL (ref 0.0–0.1)
Basophils Relative: 1 %
EOS PCT: 0 %
Eosinophils Absolute: 0 10*3/uL (ref 0.0–0.5)
HEMATOCRIT: 43.5 % (ref 39.0–52.0)
HEMOGLOBIN: 14 g/dL (ref 13.0–17.0)
Immature Granulocytes: 1 %
Lymphocytes Relative: 6 %
Lymphs Abs: 1.2 10*3/uL (ref 0.7–4.0)
MCH: 30.2 pg (ref 26.0–34.0)
MCHC: 32.2 g/dL (ref 30.0–36.0)
MCV: 93.8 fL (ref 80.0–100.0)
Monocytes Absolute: 2 10*3/uL — ABNORMAL HIGH (ref 0.1–1.0)
Monocytes Relative: 10 %
Neutro Abs: 17.1 10*3/uL — ABNORMAL HIGH (ref 1.7–7.7)
Neutrophils Relative %: 82 %
Platelets: 287 10*3/uL (ref 150–400)
RBC: 4.64 MIL/uL (ref 4.22–5.81)
RDW: 12.4 % (ref 11.5–15.5)
WBC: 20.7 10*3/uL — ABNORMAL HIGH (ref 4.0–10.5)
nRBC: 0 % (ref 0.0–0.2)

## 2018-09-15 LAB — BLOOD GAS, ARTERIAL
ACID-BASE DEFICIT: 5 mmol/L — AB (ref 0.0–2.0)
Bicarbonate: 28 mmol/L (ref 20.0–28.0)
Drawn by: 221791
O2 Content: 4 L/min
O2 Saturation: 95.1 %
pCO2 arterial: 53.5 mmHg — ABNORMAL HIGH (ref 32.0–48.0)
pH, Arterial: 7.369 (ref 7.350–7.450)
pO2, Arterial: 77.2 mmHg — ABNORMAL LOW (ref 83.0–108.0)

## 2018-09-15 LAB — PROTIME-INR
INR: 1.16
INR: 1.28
Prothrombin Time: 14.7 seconds (ref 11.4–15.2)
Prothrombin Time: 15.9 seconds — ABNORMAL HIGH (ref 11.4–15.2)

## 2018-09-15 LAB — INFLUENZA PANEL BY PCR (TYPE A & B)
INFLAPCR: NEGATIVE
Influenza B By PCR: NEGATIVE

## 2018-09-15 LAB — APTT: aPTT: 40 seconds — ABNORMAL HIGH (ref 24–36)

## 2018-09-15 LAB — PROCALCITONIN: Procalcitonin: 0.1 ng/mL

## 2018-09-15 LAB — I-STAT CG4 LACTIC ACID, ED: Lactic Acid, Venous: 1.48 mmol/L (ref 0.5–1.9)

## 2018-09-15 MED ORDER — ALBUTEROL (5 MG/ML) CONTINUOUS INHALATION SOLN
10.0000 mg/h | INHALATION_SOLUTION | RESPIRATORY_TRACT | Status: DC
  Administered 2018-09-15: 10 mg/h via RESPIRATORY_TRACT
  Filled 2018-09-15: qty 20

## 2018-09-15 MED ORDER — SODIUM CHLORIDE 0.9 % IV BOLUS
1000.0000 mL | Freq: Once | INTRAVENOUS | Status: AC
  Administered 2018-09-15: 1000 mL via INTRAVENOUS

## 2018-09-15 MED ORDER — IPRATROPIUM-ALBUTEROL 0.5-2.5 (3) MG/3ML IN SOLN
3.0000 mL | RESPIRATORY_TRACT | Status: DC
  Administered 2018-09-15 – 2018-09-17 (×13): 3 mL via RESPIRATORY_TRACT
  Filled 2018-09-15 (×12): qty 3

## 2018-09-15 MED ORDER — NICOTINE 21 MG/24HR TD PT24
21.0000 mg | MEDICATED_PATCH | Freq: Every day | TRANSDERMAL | Status: DC
  Administered 2018-09-15 – 2018-09-18 (×4): 21 mg via TRANSDERMAL
  Filled 2018-09-15 (×4): qty 1

## 2018-09-15 MED ORDER — ENOXAPARIN SODIUM 40 MG/0.4ML ~~LOC~~ SOLN
40.0000 mg | SUBCUTANEOUS | Status: DC
  Administered 2018-09-15 – 2018-09-17 (×3): 40 mg via SUBCUTANEOUS
  Filled 2018-09-15 (×3): qty 0.4

## 2018-09-15 MED ORDER — SODIUM CHLORIDE 0.9 % IV BOLUS (SEPSIS)
1000.0000 mL | Freq: Once | INTRAVENOUS | Status: AC
  Administered 2018-09-15: 1000 mL via INTRAVENOUS

## 2018-09-15 MED ORDER — DOXYCYCLINE HYCLATE 100 MG PO TABS
100.0000 mg | ORAL_TABLET | Freq: Two times a day (BID) | ORAL | Status: DC
  Administered 2018-09-15 – 2018-09-18 (×6): 100 mg via ORAL
  Filled 2018-09-15 (×6): qty 1

## 2018-09-15 MED ORDER — GUAIFENESIN ER 600 MG PO TB12
1200.0000 mg | ORAL_TABLET | Freq: Two times a day (BID) | ORAL | Status: DC
  Administered 2018-09-15 – 2018-09-18 (×6): 1200 mg via ORAL
  Filled 2018-09-15 (×6): qty 2

## 2018-09-15 MED ORDER — METHYLPREDNISOLONE SODIUM SUCC 125 MG IJ SOLR
125.0000 mg | Freq: Once | INTRAMUSCULAR | Status: AC
  Administered 2018-09-15: 125 mg via INTRAVENOUS
  Filled 2018-09-15: qty 2

## 2018-09-15 MED ORDER — SODIUM CHLORIDE 0.9 % IV SOLN
INTRAVENOUS | Status: DC
  Administered 2018-09-15 – 2018-09-17 (×4): via INTRAVENOUS

## 2018-09-15 MED ORDER — SODIUM CHLORIDE 0.9 % IV BOLUS (SEPSIS): 1000.0000 mL | Freq: Once | INTRAVENOUS | Status: DC

## 2018-09-15 MED ORDER — LEVOFLOXACIN IN D5W 750 MG/150ML IV SOLN
750.0000 mg | Freq: Once | INTRAVENOUS | Status: AC
  Administered 2018-09-15: 750 mg via INTRAVENOUS
  Filled 2018-09-15: qty 150

## 2018-09-15 MED ORDER — SODIUM CHLORIDE 0.9 % IV SOLN
1.0000 g | INTRAVENOUS | Status: DC
  Administered 2018-09-15 – 2018-09-17 (×3): 1 g via INTRAVENOUS
  Filled 2018-09-15 (×2): qty 10
  Filled 2018-09-15: qty 1
  Filled 2018-09-15 (×2): qty 10

## 2018-09-15 MED ORDER — METHYLPREDNISOLONE SODIUM SUCC 125 MG IJ SOLR
60.0000 mg | Freq: Two times a day (BID) | INTRAMUSCULAR | Status: DC
  Administered 2018-09-15 – 2018-09-17 (×4): 60 mg via INTRAVENOUS
  Filled 2018-09-15 (×4): qty 2

## 2018-09-15 MED ORDER — HYDROCOD POLST-CPM POLST ER 10-8 MG/5ML PO SUER
5.0000 mL | Freq: Once | ORAL | Status: AC
  Administered 2018-09-15: 5 mL via ORAL
  Filled 2018-09-15: qty 5

## 2018-09-15 NOTE — ED Provider Notes (Signed)
[facility] EMERGENCY DEPARTMENT Provider Note   CSN: [id number] Arrival date & time: 09/15/18  1533     History   Chief Complaint Chief Complaint  Patient presents with  . Shortness of Breath    HPI Cesar Gonzalez is a 67 y.o. male.  Pt presents to the ED today with SOB.  The pt said he has been sob for the past week.  The pt did see his PCP on 11/27 and was started on zithromax and hydrocodone cough syrup.  The pt's wife said he has been acting strangely yesterday.  She said he's not been himself.  His SOB is not improving.  He does smoke.  The pt does not have a diagnosis of COPD, but does have an inhaler which is not helping either.  When he arrived to the ED, his O2 sat was 77.  When he had to move from the wheelchair to the bed, it dropped to 68% with a good wave form.       Past Medical History:  Diagnosis Date  . Gout   . Nephritis   . Pneumonia   . Substance abuse [facility])     Patient Active Problem List   Diagnosis Date Noted  . CAP (community acquired pneumonia) 12/12/2014  . Community acquired pneumonia 12/12/2014    Past Surgical History:  Procedure Laterality Date  . TONSILLECTOMY          Home Medications    Prior to Admission medications   Medication Sig Start Date End Date Taking? Authorizing Provider  albuterol (PROVENTIL HFA;VENTOLIN HFA) 108 (90 BASE) MCG/ACT inhaler Inhale 2 puffs into the lungs every 4 (four) hours as needed for wheezing or shortness of breath. 12/07/14   Pollina, Gwenyth Allegra, MD  amoxicillin-clavulanate (AUGMENTIN) 875-125 MG per tablet Take 1 tablet by mouth 2 (two) times daily. 12/15/14   Isaac Bliss, Rayford Halsted, MD  colchicine (COLCRYS) 0.6 MG tablet Take 0.6 mg by mouth daily as needed (gout flare up).    [provider]  HYDROcodone-homatropine (HYCODAN) 5-1.5 MG/5ML syrup Take 5 mLs by mouth every 6 (six) hours as needed for cough. 12/07/14   Orpah Greek, MD  Multiple Vitamins-Minerals (SUPER  MULTI-VITAMIN PO) Take 1 Package by mouth daily.    [provider]  Omega-3 Fatty Acids (FISH OIL) 1000 MG CAPS Take 1,000 mg by mouth daily.    [provider]    Family History Family History  Problem Relation Age of Onset  . Diabetes Mother   . COPD Father   . Prostate cancer Father     Social History Social History   Tobacco Use  . Smoking status: Current Every Day Smoker    Packs/day: 1.00    Types: Cigarettes  . Smokeless tobacco: Never Used  Substance Use Topics  . Alcohol use: Not Currently    Comment: 2-3 times per week  . Drug use: No     Allergies   Nsaids   Review of Systems Review of Systems  Respiratory: Positive for cough, shortness of breath and wheezing.   All other systems reviewed and are negative.    Physical Exam Updated Vital Signs BP 137/65   Pulse (!) 109   Temp (S) (!) 97.4 F (36.3 C) (Oral)   Resp (!) 38   Ht 6\' 2"  (1.88 m)   Wt 83.9 kg   SpO2 100%   BMI 23.75 kg/m   Physical Exam  Constitutional: He is oriented to person, place, and  time. He appears ill. He appears distressed.  HENT:  Head: Normocephalic and atraumatic.  Mouth/Throat: Oropharynx is clear and moist.  Eyes: Pupils are equal, round, and reactive to light. EOM are normal.  Neck: Normal range of motion. Neck supple.  Cardiovascular: Regular rhythm. Tachycardia present.  Pulmonary/Chest: Tachypnea noted. He is in respiratory distress. He has wheezes. He has rhonchi.  Abdominal: Soft. Bowel sounds are normal.  Musculoskeletal: Normal range of motion.       Right lower leg: Normal.       Left lower leg: Normal.  Neurological: He is alert and oriented to person, place, and time.  Skin: Skin is warm and dry. Capillary refill takes less than 2 seconds.  Psychiatric: He has a normal mood and affect. His behavior is normal.  Nursing note and vitals reviewed.    ED Treatments / Results  Labs (all labs ordered are listed, but only abnormal  results are displayed) Labs Reviewed  COMPREHENSIVE METABOLIC PANEL - Abnormal; Notable for the following components:      Result Value   Chloride 96 (*)    Glucose, Bld 191 (*)    Calcium 8.7 (*)    Total Protein 8.7 (*)    Total Bilirubin 1.5 (*)    All other components within normal limits  CBC WITH DIFFERENTIAL/PLATELET - Abnormal; Notable for the following components:   WBC 20.7 (*)    Neutro Abs 17.1 (*)    Monocytes Absolute 2.0 (*)    Abs Immature Granulocytes 0.18 (*)    All other components within normal limits  BLOOD GAS, ARTERIAL - Abnormal; Notable for the following components:   pCO2 arterial 53.5 (*)    pO2, Arterial 77.2 (*)    Acid-base deficit 5.0 (*)    All other components within normal limits  CULTURE, BLOOD (ROUTINE X 2)  CULTURE, BLOOD (ROUTINE X 2)  PROTIME-INR  URINALYSIS, ROUTINE W REFLEX MICROSCOPIC  INFLUENZA PANEL BY PCR (TYPE A & B)  I-STAT CG4 LACTIC ACID, ED  I-STAT CG4 LACTIC ACID, ED    EKG EKG Interpretation  Date/Time:  Saturday September 15 2018 17:08:09 EST Ventricular Rate:  109 PR Interval:    QRS Duration: 102 QT Interval:  330 QTC Calculation: 445 R Axis:   60 Text Interpretation:  Sinus tachycardia No significant change since last tracing Confirmed by Isla Pence [phone]) on 09/15/2018 5:10:54 PM   Radiology Dg Chest Portable 1 View  Result Date: 09/15/2018 CLINICAL DATA:  Dyspnea EXAM: PORTABLE CHEST 1 VIEW COMPARISON:  11/30/2017 chest radiograph. FINDINGS: Stable cardiomediastinal silhouette with normal heart size. No pneumothorax. No pleural effusion. Patchy peripheral right midlung opacity is new. IMPRESSION: New patchy peripheral right mid lung opacity suggestive of developing pneumonia. Recommend follow-up chest radiographs to document resolution. Electronically Signed   By: Ilona Sorrel M.D.   On: 09/15/2018 16:36    Procedures Procedures (including critical care time)  Medications Ordered in ED Medications    sodium chloride 0.9 % bolus 1,000 mL (has no administration in time range)  albuterol (PROVENTIL,VENTOLIN) solution continuous neb (10 mg/hr Nebulization New Bag/Given 09/15/18 1639)  levofloxacin (LEVAQUIN) IVPB 750 mg (750 mg Intravenous New Bag/Given 09/15/18 1654)  sodium chloride 0.9 % bolus 1,000 mL (has no administration in time range)    And  sodium chloride 0.9 % bolus 1,000 mL (1,000 mLs Intravenous New Bag/Given 09/15/18 1648)    And  sodium chloride 0.9 % bolus 1,000 mL (has no administration in time range)  methylPREDNISolone sodium  succinate (SOLU-MEDROL) 125 mg/2 mL injection 125 mg (125 mg Intravenous Given 09/15/18 1647)     Initial Impression / Assessment and Plan / ED Course  I have reviewed the triage vital signs and the nursing notes.  Pertinent labs & imaging results that were available during my care of the patient were reviewed by me and considered in my medical decision making (see chart for details).    Pt placed on oxygen immediately upon arrival here.  He was given a continuous neb and solumedrol.  O2 sat has improved.  The pt does not have a formal diagnosis of COPD, but I suspect he has it.    The pt does have a RML pneumonia.  Lactic acid and BP ok, but sepsis called due to elevated HR, RR, WBC, and source.  Sepsis fluids given.  IV levaquin given since he's already been on zithromax.  He was confused at home, but not here, so I don't think he's encephalopathic.  He may have been confused due to hypoxia.  Pt d/w Dr. Roderic Palau ([facility]) for admission.  CRITICAL CARE Performed by: Isla Pence   Total critical care time: 30 minutes  Critical care time was exclusive of separately billable procedures and treating other patients.  Critical care was necessary to treat or prevent imminent or life-threatening deterioration.  Critical care was time spent personally by me on the following activities: development of treatment plan with patient and/or surrogate as  well as nursing, discussions with consultants, evaluation of patient's response to treatment, examination of patient, obtaining history from patient or surrogate, ordering and performing treatments and interventions, ordering and review of laboratory studies, ordering and review of radiographic studies, pulse oximetry and re-evaluation of patient's condition.  Final Clinical Impressions(s) / ED Diagnoses   Final diagnoses:  Pneumonia of right middle lobe due to infectious organism [facility])  Tobacco abuse  Acute respiratory failure with hypoxia ([place])  Sepsis without acute organ dysfunction, due to unspecified organism [facility])    ED Discharge Orders    None       Isla Pence, MD 09/15/18 1730

## 2018-09-15 NOTE — H&P (Signed)
History and Physical    Cesar Gonzalez:[id number] DOB: [birth year] DOA: 09/15/2018  PCP: Redmond School, MD  Patient coming from: Home  I have personally briefly reviewed patient's old medical records in [place]  Chief Complaint: Shortness of breath  HPI: Cesar Gonzalez is a 67 y.o. male with medical history significant of chronic tobacco use, reports feeling short of breath and coughing for the past 4 to 5 days.  Prior to this, he was in his usual state of health.  He developed significant cough, fevers and shortness of breath.  He was seen by his primary care physician and was prescribed a course of azithromycin.  He is currently a day for therapy, but did not notice any significant improvement.  He also received hydrocodone cough syrup, and per his wife began to act strangely.  He continues to smoke, smoking approximately 1 pack of cigarettes per day.  He does not formally have a diagnosis of COPD, but does report a 25-pack-year history.  He has been using albuterol inhaler more recently, but did not notice any benefit in his shortness of breath.  On arrival to the emergency room, he was noted to have an oxygen saturation of 77.  This dropped to 68% when he was transferred from wheelchair to bed.  ED Course: Patient was noted to be hypoxic and tachycardic in the emergency room.  He was noted to be febrile here.  Chest x-ray does not indicate pneumonia.  He has significant leukocytosis.  He had increased respiratory rate, was noted to be wheezing and short of breath.  He was started on hour-long nebulizer treatment, received antibiotics and was started on steroid therapy.  Overall his respiratory status is beginning to improve.  Review of Systems: As per HPI otherwise 10 point review of systems negative.    Past Medical History:  Diagnosis Date  . Gout   . Nephritis   . Pneumonia   . Substance abuse [facility])     Past Surgical History:  Procedure Laterality Date  .  TONSILLECTOMY      Social History:  reports that he has been smoking cigarettes. He has been smoking about 1.00 pack per day. He has never used smokeless tobacco. He reports that he drank alcohol. He reports that he does not use drugs.  Allergies  Allergen Reactions  . Nsaids Other (See Comments)    Patient states he "just isn't suppose to take them"    Family History  Problem Relation Age of Onset  . Diabetes Mother   . COPD Father   . Prostate cancer Father     Prior to Admission medications   Medication Sig Start Date End Date Taking? Authorizing Provider  albuterol (PROVENTIL HFA;VENTOLIN HFA) 108 (90 BASE) MCG/ACT inhaler Inhale 2 puffs into the lungs every 4 (four) hours as needed for wheezing or shortness of breath. 12/07/14  Yes Pollina, Gwenyth Allegra, MD  colchicine (COLCRYS) 0.6 MG tablet Take 0.6 mg by mouth daily as needed (gout flare up).   Yes [provider]  Multiple Vitamins-Minerals (SUPER MULTI-VITAMIN PO) Take 1 Package by mouth daily.   Yes [provider]  Omega-3 Fatty Acids (FISH OIL) 1000 MG CAPS Take 1,000 mg by mouth daily.   Yes [provider]    Physical Exam: Vitals:   09/15/18 1639 09/15/18 1700 09/15/18 1800 09/15/18 1821  BP:  137/65 (!) 136/58 (!) 136/58  Pulse:  (!) 109 (!) 109 (!) 101  Resp:  (!) 38 (!)  26 (!) 24  Temp:    98.2 F (36.8 C)  TempSrc:    Oral  SpO2: 100%   100%  Weight:      Height:        Constitutional: NAD, calm, comfortable Eyes: PERRL, lids and conjunctivae normal ENMT: Mucous membranes are moist. Posterior pharynx clear of any exudate or lesions.Normal dentition.  Neck: normal, supple, no masses, no thyromegaly Respiratory: Bilateral rhonchi, no wheezing. Normal respiratory effort. No accessory muscle use.  Cardiovascular: Regular rate and rhythm, no murmurs / rubs / gallops. No extremity edema. 2+ pedal pulses. No carotid bruits.  Abdomen: no tenderness, no masses palpated. No  hepatosplenomegaly. Bowel sounds positive.  Musculoskeletal: no clubbing / cyanosis. No joint deformity upper and lower extremities. Good ROM, no contractures. Normal muscle tone.  Skin: no rashes, lesions, ulcers. No induration Neurologic: CN 2-12 grossly intact. Sensation intact, DTR normal. Strength 5/5 in all 4.  Psychiatric: Normal judgment and insight. Alert and oriented x 3. Normal mood.    Labs on Admission: I have personally reviewed following labs and imaging studies  CBC: Recent Labs  Lab 09/15/18 1605  WBC 20.7*  NEUTROABS 17.1*  HGB 14.0  HCT 43.5  MCV 93.8  PLT 643   Basic Metabolic Panel: Recent Labs  Lab 09/15/18 1605  NA 135  K 4.3  CL 96*  CO2 29  GLUCOSE 191*  BUN 22  CREATININE 0.90  CALCIUM 8.7*   GFR: Estimated Creatinine Clearance: 93.9 mL/min (by C-G formula based on SCr of 0.9 mg/dL). Liver Function Tests: Recent Labs  Lab 09/15/18 1605  AST 41  ALT 36  ALKPHOS 81  BILITOT 1.5*  PROT 8.7*  ALBUMIN 3.9   No results for input(s): LIPASE, AMYLASE in the last 168 hours. No results for input(s): AMMONIA in the last 168 hours. Coagulation Profile: Recent Labs  Lab 09/15/18 1605  INR 1.16   Cardiac Enzymes: No results for input(s): CKTOTAL, CKMB, CKMBINDEX, TROPONINI in the last 168 hours. BNP (last 3 results) No results for input(s): PROBNP in the last 8760 hours. HbA1C: No results for input(s): HGBA1C in the last 72 hours. CBG: No results for input(s): GLUCAP in the last 168 hours. Lipid Profile: No results for input(s): CHOL, HDL, LDLCALC, TRIG, CHOLHDL, LDLDIRECT in the last 72 hours. Thyroid Function Tests: No results for input(s): TSH, T4TOTAL, FREET4, T3FREE, THYROIDAB in the last 72 hours. Anemia Panel: No results for input(s): VITAMINB12, FOLATE, FERRITIN, TIBC, IRON, RETICCTPCT in the last 72 hours. Urine analysis:    Component Value Date/Time   COLORURINE AMBER (A) 12/12/2014 1400   APPEARANCEUR CLEAR 12/12/2014 1400    LABSPEC 1.015 12/12/2014 1400   PHURINE 7.5 12/12/2014 1400   GLUCOSEU NEGATIVE 12/12/2014 1400   HGBUR NEGATIVE 12/12/2014 1400   BILIRUBINUR NEGATIVE 12/12/2014 1400   KETONESUR TRACE (A) 12/12/2014 1400   PROTEINUR 30 (A) 12/12/2014 1400   UROBILINOGEN 0.2 12/12/2014 1400   NITRITE NEGATIVE 12/12/2014 1400   LEUKOCYTESUR NEGATIVE 12/12/2014 1400    Radiological Exams on Admission: Dg Chest Portable 1 View  Result Date: 09/15/2018 CLINICAL DATA:  Dyspnea EXAM: PORTABLE CHEST 1 VIEW COMPARISON:  11/30/2017 chest radiograph. FINDINGS: Stable cardiomediastinal silhouette with normal heart size. No pneumothorax. No pleural effusion. Patchy peripheral right midlung opacity is new. IMPRESSION: New patchy peripheral right mid lung opacity suggestive of developing pneumonia. Recommend follow-up chest radiographs to document resolution. Electronically Signed   By: Ilona Sorrel M.D.   On: 09/15/2018 16:36  EKG: Independently reviewed.  Sinus tachycardia  Assessment/Plan Active Problems:   CAP (community acquired pneumonia)   Community acquired pneumonia   Acute respiratory failure with hypoxia ([place])   Sepsis ([place])   Tobacco abuse     1. Acute respiratory failure with hypoxia.  Suspect is related to pneumonia with possible COPD exacerbation.  Currently on 4 L of oxygen.  We will try to wean off as tolerated. 2. Community-acquired pneumonia.  He was taking azithromycin prior to admission.  Will continue on intravenous ceftriaxone as well as doxycycline.  Follow urinary antigens and sputum culture.  Influenza panel has been ordered. 3. Sepsis.  Related to pneumonia.  Continue on IV fluids per sepsis protocol.  Blood cultures been sent. 4. Possible COPD.  Patient has significant wheezing on arrival.  He does have a long history of smoking.  Will continue on bronchodilators and steroids for now.  Would likely benefit from pulmonary function test once his acute issues have  resolved. 5. Tobacco use.  Will prescribe nicotine patch.  DVT prophylaxis: Lovenox Code Status: Full code Family Communication: No family present Disposition Plan: Discharge home once improved Consults called:   Admission status: Inpatient, telemetry  Kathie Dike MD [facility] Pager [phone]  If 7PM-7AM, please contact night-coverage [email or web address] Password [facility]  09/15/2018, 6:43 PM

## 2018-09-15 NOTE — ED Triage Notes (Signed)
Patient reports SOB that started about a week ago, worsened on "Monday or Tuesday." Patient with AMS that started when he began taking cough medicine with hydrocodone on Wednesday. Patient with productive cough with yellowish color, on Zpack. Seen by PCP on Wednesday, taking a z-pack.

## 2018-09-15 NOTE — Progress Notes (Signed)
Patient placed on 6 lpm high flow. Saturation 90 - 91 and drops quickly when going to bathroom 86. Patient states he is short of breath but he presents only slightly winded. Suspect his oxygen is chronically low because of this. Blood gas also reflects this in that PCO2 is elevated along with bicarb almost in high range. X-ray shows possible beginning pneumonia? Breath sounds decreased with wheezes. Patient given flutter valve has congested cough.

## 2018-09-16 LAB — COMPREHENSIVE METABOLIC PANEL
ALBUMIN: 3 g/dL — AB (ref 3.5–5.0)
ALT: 31 U/L (ref 0–44)
AST: 30 U/L (ref 15–41)
Alkaline Phosphatase: 61 U/L (ref 38–126)
Anion gap: 8 (ref 5–15)
BUN: 16 mg/dL (ref 8–23)
CO2: 26 mmol/L (ref 22–32)
Calcium: 7.9 mg/dL — ABNORMAL LOW (ref 8.9–10.3)
Chloride: 105 mmol/L (ref 98–111)
Creatinine, Ser: 0.68 mg/dL (ref 0.61–1.24)
GFR calc Af Amer: >60 mL/min (ref >60)
GFR calc non Af Amer: >60 mL/min (ref >60)
GLUCOSE: 173 mg/dL — AB (ref 70–99)
Potassium: 4.3 mmol/L (ref 3.5–5.1)
Sodium: 139 mmol/L (ref 135–145)
Total Bilirubin: 0.5 mg/dL (ref 0.3–1.2)
Total Protein: 6.7 g/dL (ref 6.5–8.1)

## 2018-09-16 LAB — CBC
HCT: 37 % — ABNORMAL LOW (ref 39.0–52.0)
Hemoglobin: 11.7 g/dL — ABNORMAL LOW (ref 13.0–17.0)
MCH: 30.6 pg (ref 26.0–34.0)
MCHC: 31.6 g/dL (ref 30.0–36.0)
MCV: 96.9 fL (ref 80.0–100.0)
Platelets: 235 10*3/uL (ref 150–400)
RBC: 3.82 MIL/uL — ABNORMAL LOW (ref 4.22–5.81)
RDW: 12.1 % (ref 11.5–15.5)
WBC: 13.9 10*3/uL — ABNORMAL HIGH (ref 4.0–10.5)
nRBC: 0 % (ref 0.0–0.2)

## 2018-09-16 LAB — STREP PNEUMONIAE URINARY ANTIGEN: Strep Pneumo Urinary Antigen: NEGATIVE

## 2018-09-16 NOTE — Progress Notes (Signed)
Have decreased to 4 lpm, still has occasional wheezes hopefully can be titrated down as day goes on.

## 2018-09-16 NOTE — Progress Notes (Signed)
PROGRESS NOTE    Cesar Gonzalez  FWY:[id number] DOB: Jul 30, 1951 DOA: 09/15/2018 PCP: Redmond School, MD    Brief Narrative:  67 year old male with a history of tobacco use has had progressive shortness of breath, cough and fever.  He is seen his primary care physician and was prescribed a course of azithromycin.  Symptoms persisted and he became increasingly hypoxic.  On arrival to the emergency room, he had evidence of pneumonia on chest x-ray, was tachycardic, hypoxic on room air and had a significant leukocytosis.  He was admitted for further treatment of pneumonia.   Assessment & Plan:   Active Problems:   CAP (community acquired pneumonia)   Community acquired pneumonia   Acute respiratory failure with hypoxia ([place])   Sepsis ([place])   Tobacco abuse   1. Acute respiratory failure with hypoxia.  Related to pneumonia and possible COPD exacerbation.  He is weaning down on his oxygen requirement is currently on 3 L.  Continue to wean off as tolerated. 2. Community-acquired pneumonia.  Patient was taking azithromycin prior to admission.  He has been started on intravenous ceftriaxone or doxycycline.  Clinically appears to be improving.  No further fevers.  Strep pneumo antigen is negative.  Legionella antigen in process.  Influenza panel negative. 3. Sepsis.  Related to community-acquired pneumonia.  Received IV fluids per sepsis protocol.  Blood culture has not shown any growth.  Lactic acid was normal. 4. Possible COPD.  Patient was wheezing on arrival.  He does have a long history of smoking.  Continue on bronchodilators and steroid.  Would likely benefit from pulmonary function test once the acute issues have resolved. 5. Tobacco use.  Continue nicotine patch.   DVT prophylaxis: Lovenox Code Status: Full code Family Communication: No family present Disposition Plan: Discharge home once improved   Consultants:     Procedures:     Antimicrobials:   Ceftriaxone 11/30  >  Doxycycline 11/30 >   Subjective: Feeling better.  Has mildly productive cough.  Overall feels his breathing is better.  Objective: Vitals:   09/16/18 0511 09/16/18 0546 09/16/18 0919 09/16/18 1314  BP:  127/65  122/61  Pulse:  77  83  Resp:  20  19  Temp:  98.1 F (36.7 C)  97.6 F (36.4 C)  TempSrc:  Oral  Oral  SpO2: 95% 95% 94% 96%  Weight:  94.5 kg    Height:  6\' 2"  (1.88 m)      Intake/Output Summary (Last 24 hours) at 09/16/2018 1825 Last data filed at 09/16/2018 1753 Gross per 24 hour  Intake 3414.58 ml  Output 1300 ml  Net 2114.58 ml   Filed Weights   09/15/18 1607 09/15/18 2015 09/16/18 0546  Weight: 83.9 kg 94.5 kg 94.5 kg    Examination:  General exam: Appears calm and comfortable  Respiratory system: Crackles on left side, no wheezing. Respiratory effort normal. Cardiovascular system: S1 & S2 heard, RRR. No JVD, murmurs, rubs, gallops or clicks. No pedal edema. Gastrointestinal system: Abdomen is nondistended, soft and nontender. No organomegaly or masses felt. Normal bowel sounds heard. Central nervous system: Alert and oriented. No focal neurological deficits. Extremities: Symmetric 5 x 5 power. Skin: No rashes, lesions or ulcers Psychiatry: Judgement and insight appear normal. Mood & affect appropriate.     Data Reviewed: I have personally reviewed following labs and imaging studies  CBC: Recent Labs  Lab 09/15/18 1605 09/16/18 0430  WBC 20.7* 13.9*  NEUTROABS 17.1*  --   HGB  14.0 11.7*  HCT 43.5 37.0*  MCV 93.8 96.9  PLT 287 756   Basic Metabolic Panel: Recent Labs  Lab 09/15/18 1605 09/16/18 0430  NA 135 139  K 4.3 4.3  CL 96* 105  CO2 29 26  GLUCOSE 191* 173*  BUN 22 16  CREATININE 0.90 0.68  CALCIUM 8.7* 7.9*   GFR: Estimated Creatinine Clearance: 105.6 mL/min (by C-G formula based on SCr of 0.68 mg/dL). Liver Function Tests: Recent Labs  Lab 09/15/18 1605 09/16/18 0430  AST 41 30  ALT 36 31  ALKPHOS 81 61   BILITOT 1.5* 0.5  PROT 8.7* 6.7  ALBUMIN 3.9 3.0*   No results for input(s): LIPASE, AMYLASE in the last 168 hours. No results for input(s): AMMONIA in the last 168 hours. Coagulation Profile: Recent Labs  Lab 09/15/18 1605 09/15/18 2108  INR 1.16 1.28   Cardiac Enzymes: No results for input(s): CKTOTAL, CKMB, CKMBINDEX, TROPONINI in the last 168 hours. BNP (last 3 results) No results for input(s): PROBNP in the last 8760 hours. HbA1C: No results for input(s): HGBA1C in the last 72 hours. CBG: No results for input(s): GLUCAP in the last 168 hours. Lipid Profile: No results for input(s): CHOL, HDL, LDLCALC, TRIG, CHOLHDL, LDLDIRECT in the last 72 hours. Thyroid Function Tests: No results for input(s): TSH, T4TOTAL, FREET4, T3FREE, THYROIDAB in the last 72 hours. Anemia Panel: No results for input(s): VITAMINB12, FOLATE, FERRITIN, TIBC, IRON, RETICCTPCT in the last 72 hours. Sepsis Labs: Recent Labs  Lab 09/15/18 1618 09/15/18 2108  PROCALCITON  --  0.10  LATICACIDVEN 1.48  --     Recent Results (from the past 240 hour(s))  Culture, blood (Routine x 2)     Status: None (Preliminary result)   Collection Time: 09/15/18  4:33 PM  Result Value Ref Range Status   Specimen Description BLOOD RIGHT WRIST  Final   Special Requests   Final    BOTTLES DRAWN AEROBIC AND ANAEROBIC Blood Culture adequate volume   Culture   Final    NO GROWTH < 24 HOURS Performed at [facility], [address], [place], [place] [address]    Report Status PENDING  Incomplete  Culture, blood (Routine x 2)     Status: None (Preliminary result)   Collection Time: 09/15/18  4:38 PM  Result Value Ref Range Status   Specimen Description LEFT ANTECUBITAL  Final   Special Requests   Final    BOTTLES DRAWN AEROBIC AND ANAEROBIC Blood Culture adequate volume   Culture   Final    NO GROWTH < 24 HOURS Performed at [facility], [address], [place], [place] [address]    Report Status PENDING   Incomplete         Radiology Studies: Dg Chest Portable 1 View  Result Date: 09/15/2018 CLINICAL DATA:  Dyspnea EXAM: PORTABLE CHEST 1 VIEW COMPARISON:  11/30/2017 chest radiograph. FINDINGS: Stable cardiomediastinal silhouette with normal heart size. No pneumothorax. No pleural effusion. Patchy peripheral right midlung opacity is new. IMPRESSION: New patchy peripheral right mid lung opacity suggestive of developing pneumonia. Recommend follow-up chest radiographs to document resolution. Electronically Signed   By: Ilona Sorrel M.D.   On: 09/15/2018 16:36        Scheduled Meds: . doxycycline  100 mg Oral Q12H  . enoxaparin (LOVENOX) injection  40 mg Subcutaneous Q24H  . guaiFENesin  1,200 mg Oral BID  . ipratropium-albuterol  3 mL Nebulization Q4H  . methylPREDNISolone (SOLU-MEDROL) injection  60 mg Intravenous  Q12H  . nicotine  21 mg Transdermal Daily   Continuous Infusions: . sodium chloride 100 mL/hr at 09/16/18 0902  . albuterol 10 mg/hr (09/15/18 1639)  . cefTRIAXone (ROCEPHIN)  IV Stopped (09/15/18 2351)  . sodium chloride       LOS: 1 day    Time spent: 35 minutes    Kathie Dike, MD [facility] Pager [phone]  If 7PM-7AM, please contact night-coverage [email or web address] Password [facility] 09/16/2018, 6:25 PM

## 2018-09-17 ENCOUNTER — Encounter (HOSPITAL_COMMUNITY): Payer: Self-pay | Admitting: Family Medicine

## 2018-09-17 DIAGNOSIS — J441 Chronic obstructive pulmonary disease with (acute) exacerbation: Secondary | ICD-10-CM

## 2018-09-17 DIAGNOSIS — J449 Chronic obstructive pulmonary disease, unspecified: Secondary | ICD-10-CM | POA: Diagnosis present

## 2018-09-17 DIAGNOSIS — J189 Pneumonia, unspecified organism: Secondary | ICD-10-CM

## 2018-09-17 HISTORY — DX: Chronic obstructive pulmonary disease, unspecified: J44.9

## 2018-09-17 LAB — BASIC METABOLIC PANEL
Anion gap: 7 (ref 5–15)
BUN: 16 mg/dL (ref 8–23)
CO2: 27 mmol/L (ref 22–32)
Calcium: 7.9 mg/dL — ABNORMAL LOW (ref 8.9–10.3)
Chloride: 106 mmol/L (ref 98–111)
Creatinine, Ser: 0.86 mg/dL (ref 0.61–1.24)
GFR calc Af Amer: >60 mL/min (ref >60)
Glucose, Bld: 156 mg/dL — ABNORMAL HIGH (ref 70–99)
Potassium: 4.2 mmol/L (ref 3.5–5.1)
Sodium: 140 mmol/L (ref 135–145)

## 2018-09-17 LAB — LEGIONELLA PNEUMOPHILA SEROGP 1 UR AG: L. pneumophila Serogp 1 Ur Ag: NEGATIVE

## 2018-09-17 LAB — CBC
HCT: 37.3 % — ABNORMAL LOW (ref 39.0–52.0)
Hemoglobin: 11.8 g/dL — ABNORMAL LOW (ref 13.0–17.0)
MCH: 30.1 pg (ref 26.0–34.0)
MCHC: 31.6 g/dL (ref 30.0–36.0)
MCV: 95.2 fL (ref 80.0–100.0)
PLATELETS: 280 10*3/uL (ref 150–400)
RBC: 3.92 MIL/uL — ABNORMAL LOW (ref 4.22–5.81)
RDW: 12.5 % (ref 11.5–15.5)
WBC: 15.4 10*3/uL — ABNORMAL HIGH (ref 4.0–10.5)
nRBC: 0 % (ref 0.0–0.2)

## 2018-09-17 LAB — HIV ANTIBODY (ROUTINE TESTING W REFLEX): HIV Screen 4th Generation wRfx: NONREACTIVE

## 2018-09-17 MED ORDER — TRAZODONE HCL 50 MG PO TABS
50.0000 mg | ORAL_TABLET | Freq: Once | ORAL | Status: AC
  Administered 2018-09-17: 50 mg via ORAL
  Filled 2018-09-17: qty 1

## 2018-09-17 MED ORDER — METHYLPREDNISOLONE SODIUM SUCC 125 MG IJ SOLR
60.0000 mg | Freq: Four times a day (QID) | INTRAMUSCULAR | Status: DC
  Administered 2018-09-17 – 2018-09-18 (×4): 60 mg via INTRAVENOUS
  Filled 2018-09-17 (×4): qty 2

## 2018-09-17 MED ORDER — IPRATROPIUM-ALBUTEROL 0.5-2.5 (3) MG/3ML IN SOLN
3.0000 mL | Freq: Four times a day (QID) | RESPIRATORY_TRACT | Status: DC
  Administered 2018-09-18 (×3): 3 mL via RESPIRATORY_TRACT
  Filled 2018-09-17 (×3): qty 3

## 2018-09-17 NOTE — Care Management Important Message (Signed)
Important Message  Patient Details  Name: Cesar Gonzalez MRN: [id number] Date of Birth: September 12, 1951   Medicare Important Message Given:  Yes    Shelda Altes 09/17/2018, 10:39 AM

## 2018-09-17 NOTE — Progress Notes (Signed)
PROGRESS NOTE    Cesar Gonzalez  TDV:[id number] DOB: 1951/01/21 DOA: 09/15/2018 PCP: Redmond School, MD    Brief Narrative:  67 year old male with a history of tobacco use has had progressive shortness of breath, cough and fever.  He is seen his primary care physician and was prescribed a course of azithromycin.  Symptoms persisted and he became increasingly hypoxic.  On arrival to the emergency room, he had evidence of pneumonia on chest x-ray, was tachycardic, hypoxic on room air and had a significant leukocytosis.  He was admitted for further treatment of pneumonia.   Assessment & Plan:   Active Problems:   CAP (community acquired pneumonia)   Community acquired pneumonia   Acute respiratory failure with hypoxia ([place])   Sepsis ([place])   Tobacco abuse   COPD (chronic obstructive pulmonary disease) ([facility])   COPD exacerbation ([place])   1. Acute respiratory failure with hypoxia.  Related to pneumonia and acute COPD exacerbation.  He is weaning down on his oxygen requirement is currently on 3 L.  Continue to wean off as tolerated.  He may have to discharge home on oxygen.  2. Acute COPD exacerbation - treat with IV steroids, nebs, antibiotics.  Continue supplemental oxygen.  3. Community-acquired pneumonia.  Patient was taking azithromycin prior to admission.  He has been started on intravenous ceftriaxone or doxycycline.  Clinically appears to be improving.  No further fevers.  Strep pneumo antigen is negative.  Legionella antigen in process.  Influenza panel negative. 4. Sepsis.  REsolve now.  Related to community-acquired pneumonia.  Received IV fluids per sepsis protocol.  Blood culture has not shown any growth.  Lactic acid was normal. 5. COPD.  Treating as above.  He needs to stop tobacco and he has been counseled to stop.  He does have a long history of smoking.  Continue on bronchodilators and steroid.  Would likely benefit from outpatient pulmonary function test.  He may have to go  home on oxygen.   6. Tobacco use.  Continue nicotine patch.  Asked RN to do further bedside cessation counseling.    DVT prophylaxis: Lovenox Code Status: Full code Family Communication: No family present Disposition Plan: Discharge home in 1-2 days   Consultants:     Procedures:     Antimicrobials:   Ceftriaxone 11/30 >  Doxycycline 11/30 >   Subjective: Pt say that he continues to feel SOB but overall has some improvement since admission, more productive cough reported.  He has been ambulating but still requires oxygen support because he gets very SOB with ambulation.  No Chest pain.    Objective: Vitals:   09/17/18 0540 09/17/18 0800 09/17/18 1209 09/17/18 1437  BP: 133/68   (!) 141/67  Pulse: 82   80  Resp: 20   18  Temp: 97.6 F (36.4 C)   (!) 97.5 F (36.4 C)  TempSrc: Oral   Oral  SpO2: 95% 94% 95% 94%  Weight:      Height:        Intake/Output Summary (Last 24 hours) at 09/17/2018 1505 Last data filed at 09/17/2018 0900 Gross per 24 hour  Intake 600 ml  Output 450 ml  Net 150 ml   Filed Weights   09/15/18 1607 09/15/18 2015 09/16/18 0546  Weight: 83.9 kg 94.5 kg 94.5 kg    Examination:  General exam: Appears calm and comfortable  Respiratory system: diffuse rales on left lower lobe, diffuse expiratory wheezing. No increased WOB. Cardiovascular system: S1 & S2  heard, RRR. No JVD, murmurs, rubs, gallops or clicks. No pedal edema. Gastrointestinal system: Abdomen is nondistended, soft and nontender. No organomegaly or masses felt. Normal bowel sounds heard. Central nervous system: Alert and oriented. No focal neurological deficits. Extremities: Symmetric 5 x 5 power. Skin: No rashes, lesions or ulcers Psychiatry: Judgement and insight appear normal. Mood & affect appropriate.   Data Reviewed: I have personally reviewed following labs and imaging studies  CBC: Recent Labs  Lab 09/15/18 1605 09/16/18 0430 09/17/18 0430  WBC 20.7* 13.9* 15.4*    NEUTROABS 17.1*  --   --   HGB 14.0 11.7* 11.8*  HCT 43.5 37.0* 37.3*  MCV 93.8 96.9 95.2  PLT 287 235 976   Basic Metabolic Panel: Recent Labs  Lab 09/15/18 1605 09/16/18 0430 09/17/18 0430  NA 135 139 140  K 4.3 4.3 4.2  CL 96* 105 106  CO2 29 26 27   GLUCOSE 191* 173* 156*  BUN 22 16 16   CREATININE 0.90 0.68 0.86  CALCIUM 8.7* 7.9* 7.9*   GFR: Estimated Creatinine Clearance: 98.2 mL/min (by C-G formula based on SCr of 0.86 mg/dL). Liver Function Tests: Recent Labs  Lab 09/15/18 1605 09/16/18 0430  AST 41 30  ALT 36 31  ALKPHOS 81 61  BILITOT 1.5* 0.5  PROT 8.7* 6.7  ALBUMIN 3.9 3.0*   No results for input(s): LIPASE, AMYLASE in the last 168 hours. No results for input(s): AMMONIA in the last 168 hours. Coagulation Profile: Recent Labs  Lab 09/15/18 1605 09/15/18 2108  INR 1.16 1.28   Cardiac Enzymes: No results for input(s): CKTOTAL, CKMB, CKMBINDEX, TROPONINI in the last 168 hours. BNP (last 3 results) No results for input(s): PROBNP in the last 8760 hours. HbA1C: No results for input(s): HGBA1C in the last 72 hours. CBG: No results for input(s): GLUCAP in the last 168 hours. Lipid Profile: No results for input(s): CHOL, HDL, LDLCALC, TRIG, CHOLHDL, LDLDIRECT in the last 72 hours. Thyroid Function Tests: No results for input(s): TSH, T4TOTAL, FREET4, T3FREE, THYROIDAB in the last 72 hours. Anemia Panel: No results for input(s): VITAMINB12, FOLATE, FERRITIN, TIBC, IRON, RETICCTPCT in the last 72 hours. Sepsis Labs: Recent Labs  Lab 09/15/18 1618 09/15/18 2108  PROCALCITON  --  0.10  LATICACIDVEN 1.48  --     Recent Results (from the past 240 hour(s))  Culture, blood (Routine x 2)     Status: None (Preliminary result)   Collection Time: 09/15/18  4:33 PM  Result Value Ref Range Status   Specimen Description BLOOD RIGHT WRIST  Final   Special Requests   Final    BOTTLES DRAWN AEROBIC AND ANAEROBIC Blood Culture adequate volume   Culture    Final    NO GROWTH 2 DAYS Performed at [facility], [address] [place], [place] [address]    Report Status PENDING  Incomplete  Culture, blood (Routine x 2)     Status: None (Preliminary result)   Collection Time: 09/15/18  4:38 PM  Result Value Ref Range Status   Specimen Description LEFT ANTECUBITAL  Final   Special Requests   Final    BOTTLES DRAWN AEROBIC AND ANAEROBIC Blood Culture adequate volume   Culture   Final    NO GROWTH 2 DAYS Performed at [facility], [address], [place], [place] [address]    Report Status PENDING  Incomplete     Radiology Studies: Dg Chest Portable 1 View  Result Date: 09/15/2018 CLINICAL DATA:  Dyspnea EXAM: PORTABLE CHEST  1 VIEW COMPARISON:  11/30/2017 chest radiograph. FINDINGS: Stable cardiomediastinal silhouette with normal heart size. No pneumothorax. No pleural effusion. Patchy peripheral right midlung opacity is new. IMPRESSION: New patchy peripheral right mid lung opacity suggestive of developing pneumonia. Recommend follow-up chest radiographs to document resolution. Electronically Signed   By: Ilona Sorrel M.D.   On: 09/15/2018 16:36   Scheduled Meds: . doxycycline  100 mg Oral Q12H  . enoxaparin (LOVENOX) injection  40 mg Subcutaneous Q24H  . guaiFENesin  1,200 mg Oral BID  . ipratropium-albuterol  3 mL Nebulization Q4H  . methylPREDNISolone (SOLU-MEDROL) injection  60 mg Intravenous Q6H  . nicotine  21 mg Transdermal Daily   Continuous Infusions: . sodium chloride 50 mL/hr at 09/17/18 1100  . cefTRIAXone (ROCEPHIN)  IV 1 g (09/16/18 2110)     LOS: 2 days   Time spent: 25 minutes  Irwin Brakeman, MD [facility] Pager [phone]  If 7PM-7AM, please contact night-coverage [email or web address] Password [facility] 09/17/2018, 3:05 PM

## 2018-09-17 NOTE — Plan of Care (Signed)

## 2018-09-18 ENCOUNTER — Encounter (HOSPITAL_COMMUNITY): Payer: Self-pay | Admitting: Family Medicine

## 2018-09-18 MED ORDER — PREDNISONE 20 MG PO TABS: ORAL_TABLET | ORAL | 0 refills | Status: DC

## 2018-09-18 MED ORDER — GUAIFENESIN ER 600 MG PO TB12: 1200.0000 mg | ORAL_TABLET | Freq: Two times a day (BID) | ORAL | 0 refills | Status: AC

## 2018-09-18 MED ORDER — ALBUTEROL SULFATE HFA 108 (90 BASE) MCG/ACT IN AERS: 2.0000 | INHALATION_SPRAY | RESPIRATORY_TRACT | 0 refills | Status: DC | PRN

## 2018-09-18 MED ORDER — IPRATROPIUM-ALBUTEROL 0.5-2.5 (3) MG/3ML IN SOLN: 3.0000 mL | Freq: Four times a day (QID) | RESPIRATORY_TRACT | 0 refills | Status: DC | PRN

## 2018-09-18 MED ORDER — DOXYCYCLINE HYCLATE 100 MG PO TABS: 100.0000 mg | ORAL_TABLET | Freq: Two times a day (BID) | ORAL | 0 refills | Status: AC

## 2018-09-18 NOTE — Progress Notes (Signed)
Discharge instructions reviewed with patient and patient's wife. Both verbalized understanding of instructions. Patient discharged home with wife in stable condition.  

## 2018-09-18 NOTE — Discharge Instructions (Signed)
Follow with Primary MD  [employer], MD  and other consultant's as instructed your Hospitalist MD  Please get a complete blood count and chemistry panel checked by your Primary MD at your next visit, and again as instructed by your Primary MD.  Get Medicines reviewed and adjusted: Please take all your medications with you for your next visit with your Primary MD  Laboratory/radiological data: Please request your Primary MD to go over all hospital tests and procedure/radiological results at the follow up, please ask your Primary MD to get all Hospital records sent to his/her office.  In some cases, they will be blood work, cultures and biopsy results pending at the time of your discharge. Please request that your primary care M.D. follows up on these results.  Also Note the following: If you experience worsening of your admission symptoms, develop shortness of breath, life threatening emergency, suicidal or homicidal thoughts you must seek medical attention immediately by calling 911 or calling your MD immediately  if symptoms less severe.  You must read complete instructions/literature along with all the possible adverse reactions/side effects for all the Medicines you take and that have been prescribed to you. Take any new Medicines after you have completely understood and accpet all the possible adverse reactions/side effects.   Do not drive when taking Pain medications or sleeping medications (Benzodaizepines)  Do not take more than prescribed Pain, Sleep and Anxiety Medications. It is not advisable to combine anxiety,sleep and pain medications without talking with your primary care practitioner  Special Instructions: If you have smoked or chewed Tobacco  in the last 2 yrs please stop smoking, stop any regular Alcohol  and or any Recreational drug use.  Wear Seat belts while driving.  Please note: You were cared for by a hospitalist during your hospital stay. Once you are discharged,  your primary care physician will handle any further medical issues. Please note that NO REFILLS for any discharge medications will be authorized once you are discharged, as it is imperative that you return to your primary care physician (or establish a relationship with a primary care physician if you do not have one) for your post hospital discharge needs so that they can reassess your need for medications and monitor your lab values.      Acute Respiratory Distress Syndrome, Adult Acute respiratory distress syndrome is a life-threatening condition in which fluid collects in the lungs. This prevents the lungs from filling with air and passing oxygen into the blood. This can cause the lungs and other vital organs to fail. The condition usually develops following an infection, illness, surgery, or injury. What are the causes? This condition may be caused by:  An infection, such as sepsis or pneumonia.  A serious injury to the head or chest.  Severe bleeding from an injury.  A major surgery.  Breathing in harmful chemicals or smoke.  Blood transfusions.  A blood clot in the lungs.  Breathing in vomit (aspiration).  Near-drowning.  Inflammation of the pancreas (pancreatitis).  A drug overdose.  What are the signs or symptoms? Sudden shortness of breath and rapid breathing are the main symptoms of this condition. Other symptoms may include:  A fast or irregular heartbeat.  Skin, lips, or fingernails that look blue (cyanosis).  Confusion.  Tiredness or loss of energy.  Chest pain, particularly while taking a breath.  Coughing.  Restlessness or anxiety.  Fever. This is usually present if there is an underlying infection, such as pneumonia.  How  is this diagnosed? This condition is diagnosed based on:  Your symptoms.  Medical history.  A physical exam. During the exam, your health care provider will listen to your heart and check for crackling or wheezing sounds in  your lungs.  You may also have other tests to confirm the diagnosis and measure how well your lungs are working. These may include:  Measuring the amount of oxygen in your blood. Your health care provider will use two methods to do this procedure: ? A small device (pulse oximeter) that is placed on your finger, earlobe, or toe. ? An arterial blood gas test. A sample of blood is taken from an artery and tested for oxygen levels.  Blood tests.  Chest X-rays or CT scans to look for fluid in the lungs.  Taking a sample of your sputum to test for infection.  Heart test, such as an echocardiogram or electrocardiogram. This is done to rule out any heart problems (such as heart failure) that may be causing your symptoms.  Bronchoscopy. During this test, a thin, flexible tube with a light is passed into the mouth or nose, down the windpipe, and into the lungs.  How is this treated? Treatment depends on the cause of your condition. The goal is to support you while your lungs heal and the underlying cause is treated. Treatment may include:  Oxygen therapy. This may be done through: ? A tube in your nose or a face mask. ? A ventilator. This device helps move air into and out of your lungs through a breathing tube that is inserted into your mouth or nose.  Continuous positive airway pressure (CPAP). This treatment uses mild air pressure to keep the airways open. A mask or other device will be placed over your nose or mouth.  Tracheostomy. During this procedure, a small cut is made in your neck to create an opening to your windpipe. A breathing tube is placed directly into your windpipe. The breathing tube is connected to a ventilator. This is done if you have problems with your airway or if you need a ventilator for a long period of time.  Positioning you to lie on your stomach (prone position).  Medicines, such as: ? Sedatives to help you relax. ? Blood pressure medicines. ? Antibiotics to  treat infection. ? Blood thinners to prevent blood clots. ? Diuretics to help prevent excess fluid.  Fluids and nutrients given through an IV tube.  Wearing compression stockings on your legs to prevent blood clots.  Extra corporeal membrane oxygenation (ECMO). This treatment takes blood outside your body, adds oxygen, and removes carbon dioxide. The blood is then returned to your body. This treatment is only used in severe cases.  Follow these instructions at home:  Take over-the-counter and prescription medicines only as told by your health care provider.  Do not use any products that contain nicotine or tobacco, such as cigarettes and e-cigarettes. If you need help quitting, ask your health care provider.  Limit alcohol intake to no more than 1 drink per day for nonpregnant women and 2 drinks per day for men. One drink equals 12 oz of beer, 5 oz of wine, or 1 oz of hard liquor.  Ask friends and family to help you if daily activities make you tired.  Attend any pulmonary rehabilitation as told by your health care provider. This may include: ? Education about your condition. ? Exercises. ? Breathing training. ? Counseling. ? Learning techniques to conserve energy. ? Nutrition counseling.  Keep all follow-up visits as told by your health care provider. This is important. Contact a health care provider if:  You become short of breath during activity or while resting.  You develop a cough that does not go away.  You have a fever.  Your symptoms do not get better or they get worse.  You become anxious or depressed. Get help right away if:  You have sudden shortness of breath.  You develop sudden chest pain that does not go away.  You develop a rapid heart rate.  You develop swelling or pain in one of your legs.  You cough up blood.  You have trouble breathing.  Your skin, lips, or fingernails turn blue. These symptoms may represent a serious problem that is an  emergency. Do not wait to see if the symptoms will go away. Get medical help right away. Call your local emergency services (911 in the [place] Do not drive yourself to the hospital. Summary  Acute respiratory distress syndrome is a life-threatening condition in which fluid collects in the lungs, which leads the lungs and other vital organs to fail.  This condition usually develops following an infection, illness, surgery, or injury.  Sudden shortness of breath and rapid breathing are the main symptoms of acute respiratory distress syndrome.  Treatment may include oxygen therapy, continuous positive airway pressure (CPAP), tracheostomy, lying on your stomach (prone position), medicines, fluids and nutrients given through an IV tube, compression stockings, and extra corporeal membrane oxygenation (ECMO). This information is not intended to replace advice given to you by your health care provider. Make sure you discuss any questions you have with your health care provider. Document Released: 10/03/2005 Document Revised: 09/19/2016 Document Reviewed: 09/19/2016 Elsevier Interactive Patient Education  2017 [place] Pneumonia, Adult Pneumonia is an infection of the lungs. One type of pneumonia can happen while a person is in a hospital. A different type can happen when a person is not in a hospital (community-acquired pneumonia). It is easy for this kind to spread from person to person. It can spread to you if you breathe near an infected person who coughs or sneezes. Some symptoms include:  A dry cough.  A wet (productive) cough.  Fever.  Sweating.  Chest pain.  Follow these instructions at home:  Take over-the-counter and prescription medicines only as told by your doctor. ? Only take cough medicine if you are losing sleep. ? If you were prescribed an antibiotic medicine, take it as told by your doctor. Do not stop taking the antibiotic even if you start to feel  better.  Sleep with your head and neck raised (elevated). You can do this by putting a few pillows under your head, or you can sleep in a recliner.  Do not use tobacco products. These include cigarettes, chewing tobacco, and e-cigarettes. If you need help quitting, ask your doctor.  Drink enough water to keep your pee (urine) clear or pale yellow. A shot (vaccine) can help prevent pneumonia. Shots are often suggested for:  People older than 66 years of age.  People older than 66 years of age: ? Who are having cancer treatment. ? Who have long-term (chronic) lung disease. ? Who have problems with their body's defense system (immune system).  You may also prevent pneumonia if you take these actions:  Get the flu (influenza) shot every year.  Go to the dentist as often as told.  Wash your hands often. If soap and water are not  available, use hand sanitizer.  Contact a doctor if:  You have a fever.  You lose sleep because your cough medicine does not help. Get help right away if:  You are short of breath and it gets worse.  You have more chest pain.  Your sickness gets worse. This is very serious if: ? You are an older adult. ? Your body's defense system is weak.  You cough up blood. This information is not intended to replace advice given to you by your health care provider. Make sure you discuss any questions you have with your health care provider. Document Released: 03/21/2008 Document Revised: 03/10/2016 Document Reviewed: 01/28/2015 Elsevier Interactive Patient Education  2018 [employer].    Steps to Quit Smoking Smoking tobacco can be harmful to your health and can affect almost every organ in your body. Smoking puts you, and those around you, at risk for developing many serious chronic diseases. Quitting smoking is difficult, but it is one of the best things that you can do for your health. It is never too late to quit. What are the benefits of quitting  smoking? When you quit smoking, you lower your risk of developing serious diseases and conditions, such as:  Lung cancer or lung disease, such as COPD.  Heart disease.  Stroke.  Heart attack.  Infertility.  Osteoporosis and bone fractures.  Additionally, symptoms such as coughing, wheezing, and shortness of breath may get better when you quit. You may also find that you get sick less often because your body is stronger at fighting off colds and infections. If you are pregnant, quitting smoking can help to reduce your chances of having a baby of low birth weight. How do I get ready to quit? When you decide to quit smoking, create a plan to make sure that you are successful. Before you quit:  Pick a date to quit. Set a date within the next two weeks to give you time to prepare.  Write down the reasons why you are quitting. Keep this list in places where you will see it often, such as on your bathroom mirror or in your car or wallet.  Identify the people, places, things, and activities that make you want to smoke (triggers) and avoid them. Make sure to take these actions: ? Throw away all cigarettes at home, at work, and in your car. ? Throw away smoking accessories, such as Scientist, research (medical). ? Clean your car and make sure to empty the ashtray. ? Clean your home, including curtains and carpets.  Tell your family, friends, and coworkers that you are quitting. Support from your loved ones can make quitting easier.  Talk with your health care provider about your options for quitting smoking.  Find out what treatment options are covered by your health insurance.  What strategies can I use to quit smoking? Talk with your healthcare provider about different strategies to quit smoking. Some strategies include:  Quitting smoking altogether instead of gradually lessening how much you smoke over a period of time. Research shows that quitting cold [place] is more successful than  gradually quitting.  Attending in-person counseling to help you build problem-solving skills. You are more likely to have success in quitting if you attend several counseling sessions. Even short sessions of 10 minutes can be effective.  Finding resources and support systems that can help you to quit smoking and remain smoke-free after you quit. These resources are most helpful when you use them often. They can include: ?  Online chats with a Social worker. ? Telephone quitlines. ? Careers information officer. ? Support groups or group counseling. ? Text messaging programs. ? Mobile phone applications.  Taking medicines to help you quit smoking. (If you are pregnant or breastfeeding, talk with your health care provider first.) Some medicines contain nicotine and some do not. Both types of medicines help with cravings, but the medicines that include nicotine help to relieve withdrawal symptoms. Your health care provider may recommend: ? Nicotine patches, gum, or lozenges. ? Nicotine inhalers or sprays. ? Non-nicotine medicine that is taken by mouth.  Talk with your health care provider about combining strategies, such as taking medicines while you are also receiving in-person counseling. Using these two strategies together makes you more likely to succeed in quitting than if you used either strategy on its own. If you are pregnant or breastfeeding, talk with your health care provider about finding counseling or other support strategies to quit smoking. Do not take medicine to help you quit smoking unless told to do so by your health care provider. What things can I do to make it easier to quit? Quitting smoking might feel overwhelming at first, but there is a lot that you can do to make it easier. Take these important actions:  Reach out to your family and friends and ask that they support and encourage you during this time. Call telephone quitlines, reach out to support groups, or work with a  counselor for support.  Ask people who smoke to avoid smoking around you.  Avoid places that trigger you to smoke, such as bars, parties, or smoke-break areas at work.  Spend time around people who do not smoke.  Lessen stress in your life, because stress can be a smoking trigger for some people. To lessen stress, try: ? Exercising regularly. ? Deep-breathing exercises. ? Yoga. ? Meditating. ? Performing a body scan. This involves closing your eyes, scanning your body from head to toe, and noticing which parts of your body are particularly tense. Purposefully relax the muscles in those areas.  Download or purchase mobile phone or tablet apps (applications) that can help you stick to your quit plan by providing reminders, tips, and encouragement. There are many free apps, such as QuitGuide from the [employer] Office manager for Disease Control and Prevention). You can find other support for quitting smoking (smoking cessation) through smokefree.gov and other websites.  How will I feel when I quit smoking? Within the first 24 hours of quitting smoking, you may start to feel some withdrawal symptoms. These symptoms are usually most noticeable 2-3 days after quitting, but they usually do not last beyond 2-3 weeks. Changes or symptoms that you might experience include:  Mood swings.  Restlessness, anxiety, or irritation.  Difficulty concentrating.  Dizziness.  Strong cravings for sugary foods in addition to nicotine.  Mild weight gain.  Constipation.  Nausea.  Coughing or a sore throat.  Changes in how your medicines work in your body.  A depressed mood.  Difficulty sleeping (insomnia).  After the first 2-3 weeks of quitting, you may start to notice more positive results, such as:  Improved sense of smell and taste.  Decreased coughing and sore throat.  Slower heart rate.  Lower blood pressure.  Clearer skin.  The ability to breathe more easily.  Fewer sick days.  Quitting  smoking is very challenging for most people. Do not get discouraged if you are not successful the first time. Some people need to make many attempts to quit  before they achieve long-term success. Do your best to stick to your quit plan, and talk with your health care provider if you have any questions or concerns. This information is not intended to replace advice given to you by your health care provider. Make sure you discuss any questions you have with your health care provider. Document Released: 09/27/2001 Document Revised: 05/31/2016 Document Reviewed: 02/17/2015 Elsevier Interactive Patient Education  2018 [place] with Quitting Smoking Quitting smoking is a physical and mental challenge. You will face cravings, withdrawal symptoms, and temptation. Before quitting, work with your health care provider to make a plan that can help you cope. Preparation can help you quit and keep you from giving in. How can I cope with cravings? Cravings usually last for 5-10 minutes. If you get through it, the craving will pass. Consider taking the following actions to help you cope with cravings:  Keep your mouth busy: ? Chew sugar-free gum. ? Suck on hard candies or a straw. ? Brush your teeth.  Keep your hands and body busy: ? Immediately change to a different activity when you feel a craving. ? Squeeze or play with a ball. ? Do an activity or a hobby, like making bead jewelry, practicing needlepoint, or working with wood. ? Mix up your normal routine. ? Take a short exercise break. Go for a quick walk or run up and down stairs. ? Spend time in public places where smoking is not allowed.  Focus on doing something kind or helpful for someone else.  Call a friend or family member to talk during a craving.  Join a support group.  Call a quit line, such as 1-800-QUIT-NOW.  Talk with your health care provider about medicines that might help you cope with cravings and make quitting  easier for you.  How can I deal with withdrawal symptoms? Your body may experience negative effects as it tries to get used to not having nicotine in the system. These effects are called withdrawal symptoms. They may include:  Feeling hungrier than normal.  Trouble concentrating.  Irritability.  Trouble sleeping.  Feeling depressed.  Restlessness and agitation.  Craving a cigarette.  To manage withdrawal symptoms:  Avoid places, people, and activities that trigger your cravings.  Remember why you want to quit.  Get plenty of sleep.  Avoid coffee and other caffeinated drinks. These may worsen some of your symptoms.  How can I handle social situations? Social situations can be difficult when you are quitting smoking, especially in the first few weeks. To manage this, you can:  Avoid parties, bars, and other social situations where people might be smoking.  Avoid alcohol.  Leave right away if you have the urge to smoke.  Explain to your family and friends that you are quitting smoking. Ask for understanding and support.  Plan activities with friends or family where smoking is not an option.  What are some ways I can cope with stress? Wanting to smoke may cause stress, and stress can make you want to smoke. Find ways to manage your stress. Relaxation techniques can help. For example:  Breathe slowly and deeply, in through your nose and out through your mouth.  Listen to soothing, relaxing music.  Talk with a family member or friend about your stress.  Light a candle.  Soak in a bath or take a shower.  Think about a peaceful place.  What are some ways I can prevent weight gain? Be aware that many people gain  weight after they quit smoking. However, not everyone does. To keep from gaining weight, have a plan in place before you quit and stick to the plan after you quit. Your plan should include:  Having healthy snacks. When you have a craving, it may help  to: ? Eat plain popcorn, crunchy carrots, celery, or other cut vegetables. ? Chew sugar-free gum.  Changing how you eat: ? Eat small portion sizes at meals. ? Eat 4-6 small meals throughout the day instead of 1-2 large meals a day. ? Be mindful when you eat. Do not watch television or do other things that might distract you as you eat.  Exercising regularly: ? Make time to exercise each day. If you do not have time for a long workout, do short bouts of exercise for 5-10 minutes several times a day. ? Do some form of strengthening exercise, like weight lifting, and some form of aerobic exercise, like running or swimming.  Drinking plenty of water or other low-calorie or no-calorie drinks. Drink 6-8 glasses of water daily, or as much as instructed by your health care provider.  Summary  Quitting smoking is a physical and mental challenge. You will face cravings, withdrawal symptoms, and temptation to smoke again. Preparation can help you as you go through these challenges.  You can cope with cravings by keeping your mouth busy (such as by chewing gum), keeping your body and hands busy, and making calls to family, friends, or a helpline for people who want to quit smoking.  You can cope with withdrawal symptoms by avoiding places where people smoke, avoiding drinks with caffeine, and getting plenty of rest.  Ask your health care provider about the different ways to prevent weight gain, avoid stress, and handle social situations. This information is not intended to replace advice given to you by your health care provider. Make sure you discuss any questions you have with your health care provider. Document Released: 09/30/2016 Document Revised: 09/30/2016 Document Reviewed: 09/30/2016 Elsevier Interactive Patient Education  [employer].

## 2018-09-18 NOTE — Care Management (Addendum)
Per RN, patient remains at 95% on room air. Patient will not qualify for oxygen.

## 2018-09-18 NOTE — Care Management Note (Signed)
Case Management Note  Patient Details  Name: Cesar Gonzalez MRN: [id number] Date of Birth: Jul 17, 1951  Subjective/Objective:        Pneumonia. Independent. Will need neb machine and maybe oxygen. Patient on room air during assessment and 95%. Will need home O2 evaluation to be completed.             Action/Plan: Offered choice of DME agencies.  [facility] notified and will deliver neb machine to room.  Home O2 eval pending.   Expected Discharge Date:      09/18/2018            Expected Discharge Plan:  Home/Self Care  In-House Referral:     Discharge planning Services  CM Consult  Post Acute Care Choice:  Durable Medical Equipment Choice offered to:  Patient  DME Arranged:  Nebulizer machine DME Agency:     HH Arranged:    [place] Agency:     Status of Service:  Completed, signed off  If discussed at [employer] of Stay Meetings, dates discussed:    Additional Comments:  Deonna Krummel, Chauncey Reading, RN 09/18/2018, 10:54 AM

## 2018-09-18 NOTE — Discharge Summary (Signed)
Physician Discharge Summary  Cesar Gonzalez [id number] DOB: 06/03/1951 DOA: 09/15/2018  PCP: Redmond School, MD  Admit date: 09/15/2018 Discharge date: 09/18/2018  Admitted From: Home  Disposition: Home   Recommendations for Outpatient Follow-up:  1. Follow up with PCP in 1 weeks  Discharge Condition: STABLE   CODE STATUS: FULL    Brief Hospitalization Summary: Please see all hospital notes, images, labs for full details of the hospitalization. HPI: Cesar Gonzalez is a 66 y.o. male with medical history significant of chronic tobacco use, reports feeling short of breath and coughing for the past 4 to 5 days.  Prior to this, he was in his usual state of health.  He developed significant cough, fevers and shortness of breath.  He was seen by his primary care physician and was prescribed a course of azithromycin.  He is currently a day for therapy, but did not notice any significant improvement.  He also received hydrocodone cough syrup, and per his wife began to act strangely.  He continues to smoke, smoking approximately 1 pack of cigarettes per day.  He does not formally have a diagnosis of COPD, but does report a 25-pack-year history.  He has been using albuterol inhaler more recently, but did not notice any benefit in his shortness of breath.  On arrival to the emergency room, he was noted to have an oxygen saturation of 77.  This dropped to 68% when he was transferred from wheelchair to bed.  ED Course: Patient was noted to be hypoxic and tachycardic in the emergency room.  He was noted to be febrile here.  Chest x-ray does not indicate pneumonia.  He has significant leukocytosis.  He had increased respiratory rate, was noted to be wheezing and short of breath.  He was started on hour-long nebulizer treatment, received antibiotics and was started on steroid therapy.  Overall his respiratory status is beginning to improve.  1. COPD exacerbation - treated with IV steroids, nebs,  antibiotics.  He does not require oxygen and can discharge home with no oxygen.    2. Community-acquired pneumonia.  2 more days of doxycycline to fully treat for 5 days.   Clinically appears to be improving.  No further fevers.  Strep pneumo antigen is negative.  Legionella antigen in process.  Influenza panel negative. 3. Sepsis.  REsolved now.  Related to community-acquired pneumonia.  Received IV fluids per sepsis protocol.  Blood culture has not shown any growth.  Lactic acid was normal. 4. COPD.  Treating as above.  He needs to stop tobacco and he has been counseled to stop.  He does have a long history of smoking.  Continue on bronchodilators and steroid.  Would likely benefit from outpatient pulmonary function test.  He may have to go home on oxygen.   5. Tobacco use.  Continue nicotine patch.  Asked RN to do further bedside cessation counseling.    DVT prophylaxis: Lovenox Code Status: Full code Family Communication: No family present Disposition Plan: Discharge home   Consultants:     Procedures:     Antimicrobials:   Ceftriaxone 11/30 >  Doxycycline 11/30 >  Discharge Diagnoses:  Active Problems:   CAP (community acquired pneumonia)   Community acquired pneumonia   Acute respiratory failure with hypoxia (HCC)   Sepsis ([place])   Tobacco abuse   COPD (chronic obstructive pulmonary disease) (HCC)   COPD exacerbation ([place])    Discharge Instructions: Discharge Instructions    Call MD for:  difficulty breathing, headache  or visual disturbances   Complete by:  As directed    Call MD for:  extreme fatigue   Complete by:  As directed    Call MD for:  persistant dizziness or light-headedness   Complete by:  As directed    Call MD for:  persistant nausea and vomiting   Complete by:  As directed    Call MD for:  severe uncontrolled pain   Complete by:  As directed      Allergies as of 09/18/2018      Reactions   Nsaids Other (See Comments)   Patient states  he "just isn't suppose to take them"      Medication List    TAKE these medications   albuterol 108 (90 Base) MCG/ACT inhaler Commonly known as:  PROVENTIL HFA;VENTOLIN HFA Inhale 2 puffs into the lungs every 4 (four) hours as needed for wheezing or shortness of breath.   COLCRYS 0.6 MG tablet Generic drug:  colchicine Take 0.6 mg by mouth daily as needed (gout flare up).   doxycycline 100 MG tablet Commonly known as:  VIBRA-TABS Take 1 tablet (100 mg total) by mouth every 12 (twelve) hours for 2 days.   Fish Oil 1000 MG Caps Take 1,000 mg by mouth daily.   guaiFENesin 600 MG 12 hr tablet Commonly known as:  MUCINEX Take 2 tablets (1,200 mg total) by mouth 2 (two) times daily for 5 days.   ipratropium-albuterol 0.5-2.5 (3) MG/3ML Soln Commonly known as:  DUONEB Take 3 mLs by nebulization every 6 (six) hours as needed (wheezing, cough, shortness of breath).   predniSONE 20 MG tablet Commonly known as:  DELTASONE Take 3 PO QAM x3days, 2 PO QAM x3days, 1 PO QAM x3days Start taking on:  09/19/2018   SUPER MULTI-VITAMIN PO Take 1 Package by mouth daily.            Durable Medical Equipment  (From admission, onward)         Start     Ordered   09/18/18 0911  For home use only DME Nebulizer machine  Once    Question Answer Comment  Patient needs a nebulizer to treat with the following condition COPD (chronic obstructive pulmonary disease) ([place])   Patient needs a nebulizer to treat with the following condition COPD with acute exacerbation ([place])      09/18/18 0910         Follow-up Information    Redmond School, MD. Schedule an appointment as soon as possible for a visit in 1 week(s).   Specialty:  Internal Medicine Why:  Hospital follow-up visit Contact information: [address] [place] [address] [phone]          Allergies  Allergen Reactions  . Nsaids Other (See Comments)    Patient states he "just isn't suppose to take them"    Allergies as of 09/18/2018      Reactions   Nsaids Other (See Comments)   Patient states he "just isn't suppose to take them"      Medication List    TAKE these medications   albuterol 108 (90 Base) MCG/ACT inhaler Commonly known as:  PROVENTIL HFA;VENTOLIN HFA Inhale 2 puffs into the lungs every 4 (four) hours as needed for wheezing or shortness of breath.   COLCRYS 0.6 MG tablet Generic drug:  colchicine Take 0.6 mg by mouth daily as needed (gout flare up).   doxycycline 100 MG tablet Commonly known as:  VIBRA-TABS Take 1 tablet (100 mg  total) by mouth every 12 (twelve) hours for 2 days.   Fish Oil 1000 MG Caps Take 1,000 mg by mouth daily.   guaiFENesin 600 MG 12 hr tablet Commonly known as:  MUCINEX Take 2 tablets (1,200 mg total) by mouth 2 (two) times daily for 5 days.   ipratropium-albuterol 0.5-2.5 (3) MG/3ML Soln Commonly known as:  DUONEB Take 3 mLs by nebulization every 6 (six) hours as needed (wheezing, cough, shortness of breath).   predniSONE 20 MG tablet Commonly known as:  DELTASONE Take 3 PO QAM x3days, 2 PO QAM x3days, 1 PO QAM x3days Start taking on:  09/19/2018   SUPER MULTI-VITAMIN PO Take 1 Package by mouth daily.            Durable Medical Equipment  (From admission, onward)         Start     Ordered   09/18/18 0911  For home use only DME Nebulizer machine  Once    Question Answer Comment  Patient needs a nebulizer to treat with the following condition COPD (chronic obstructive pulmonary disease) ([place])   Patient needs a nebulizer to treat with the following condition COPD with acute exacerbation ([place])      09/18/18 0910          Procedures/Studies: Dg Chest Portable 1 View  Result Date: 09/15/2018 CLINICAL DATA:  Dyspnea EXAM: PORTABLE CHEST 1 VIEW COMPARISON:  11/30/2017 chest radiograph. FINDINGS: Stable cardiomediastinal silhouette with normal heart size. No pneumothorax. No pleural effusion. Patchy peripheral right midlung  opacity is new. IMPRESSION: New patchy peripheral right mid lung opacity suggestive of developing pneumonia. Recommend follow-up chest radiographs to document resolution. Electronically Signed   By: Ilona Sorrel M.D.   On: 09/15/2018 16:36      Subjective: Pt says that he feels much better today. He wants to go home.   Discharge Exam: Vitals:   09/18/18 0551 09/18/18 0722  BP: (!) 151/72   Pulse: 68   Resp: 18   Temp: 98 F (36.7 C)   SpO2: 94% 93%   Vitals:   09/17/18 2140 09/18/18 0140 09/18/18 0551 09/18/18 0722  BP: 138/63  (!) 151/72   Pulse: 83  68   Resp: 20  18   Temp: 98.5 F (36.9 C)  98 F (36.7 C)   TempSrc: Oral  Oral   SpO2: 95% 92% 94% 93%  Weight:      Height:        General exam: Appears calm and comfortable  Respiratory system: rare expiratory wheezing otherwise clear. No increased WOB. Cardiovascular system: S1 & S2 heard, RRR. No JVD, murmurs, rubs, gallops or clicks. No pedal edema. Gastrointestinal system: Abdomen is nondistended, soft and nontender. No organomegaly or masses felt. Normal bowel sounds heard. Central nervous system: Alert and oriented. No focal neurological deficits. Extremities: Symmetric 5 x 5 power. Skin: No rashes, lesions or ulcers Psychiatry: Judgement and insight appear normal. Mood & affect appropriate.   The results of significant diagnostics from this hospitalization (including imaging, microbiology, ancillary and laboratory) are listed below for reference.     Microbiology: Recent Results (from the past 240 hour(s))  Culture, blood (Routine x 2)     Status: None (Preliminary result)   Collection Time: 09/15/18  4:33 PM  Result Value Ref Range Status   Specimen Description BLOOD RIGHT WRIST  Final   Special Requests   Final    BOTTLES DRAWN AEROBIC AND ANAEROBIC Blood Culture adequate volume   Culture  Final    NO GROWTH 3 DAYS Performed at [facility], [address], [place], [place] [address]    Report Status  PENDING  Incomplete  Culture, blood (Routine x 2)     Status: None (Preliminary result)   Collection Time: 09/15/18  4:38 PM  Result Value Ref Range Status   Specimen Description LEFT ANTECUBITAL  Final   Special Requests   Final    BOTTLES DRAWN AEROBIC AND ANAEROBIC Blood Culture adequate volume   Culture   Final    NO GROWTH 3 DAYS Performed at [facility], [address] [place], [place] [address]    Report Status PENDING  Incomplete     Labs: BNP (last 3 results) No results for input(s): BNP in the last 8760 hours. Basic Metabolic Panel: Recent Labs  Lab 09/15/18 1605 09/16/18 0430 09/17/18 0430  NA 135 139 140  K 4.3 4.3 4.2  CL 96* 105 106  CO2 29 26 27   GLUCOSE 191* 173* 156*  BUN 22 16 16   CREATININE 0.90 0.68 0.86  CALCIUM 8.7* 7.9* 7.9*   Liver Function Tests: Recent Labs  Lab 09/15/18 1605 09/16/18 0430  AST 41 30  ALT 36 31  ALKPHOS 81 61  BILITOT 1.5* 0.5  PROT 8.7* 6.7  ALBUMIN 3.9 3.0*   No results for input(s): LIPASE, AMYLASE in the last 168 hours. No results for input(s): AMMONIA in the last 168 hours. CBC: Recent Labs  Lab 09/15/18 1605 09/16/18 0430 09/17/18 0430  WBC 20.7* 13.9* 15.4*  NEUTROABS 17.1*  --   --   HGB 14.0 11.7* 11.8*  HCT 43.5 37.0* 37.3*  MCV 93.8 96.9 95.2  PLT 287 235 280   Cardiac Enzymes: No results for input(s): CKTOTAL, CKMB, CKMBINDEX, TROPONINI in the last 168 hours. BNP: Invalid input(s): POCBNP CBG: No results for input(s): GLUCAP in the last 168 hours. D-Dimer No results for input(s): DDIMER in the last 72 hours. Hgb A1c No results for input(s): HGBA1C in the last 72 hours. Lipid Profile No results for input(s): CHOL, HDL, LDLCALC, TRIG, CHOLHDL, LDLDIRECT in the last 72 hours. Thyroid function studies No results for input(s): TSH, T4TOTAL, T3FREE, THYROIDAB in the last 72 hours.  Invalid input(s): FREET3 Anemia work up No results for input(s): VITAMINB12, FOLATE, FERRITIN, TIBC, IRON,  RETICCTPCT in the last 72 hours. Urinalysis    Component Value Date/Time   COLORURINE AMBER (A) 09/15/2018 2119   APPEARANCEUR CLEAR 09/15/2018 2119   LABSPEC 1.025 09/15/2018 2119   PHURINE 5.0 09/15/2018 2119   GLUCOSEU NEGATIVE 09/15/2018 2119   HGBUR NEGATIVE 09/15/2018 2119   BILIRUBINUR SMALL (A) 09/15/2018 2119   KETONESUR NEGATIVE 09/15/2018 2119   PROTEINUR 100 (A) 09/15/2018 2119   UROBILINOGEN 0.2 12/12/2014 1400   NITRITE NEGATIVE 09/15/2018 2119   LEUKOCYTESUR NEGATIVE 09/15/2018 2119   Sepsis Labs Invalid input(s): PROCALCITONIN,  WBC,  LACTICIDVEN Microbiology Recent Results (from the past 240 hour(s))  Culture, blood (Routine x 2)     Status: None (Preliminary result)   Collection Time: 09/15/18  4:33 PM  Result Value Ref Range Status   Specimen Description BLOOD RIGHT WRIST  Final   Special Requests   Final    BOTTLES DRAWN AEROBIC AND ANAEROBIC Blood Culture adequate volume   Culture   Final    NO GROWTH 3 DAYS Performed at [facility], [address] [place], [place] [address]    Report Status PENDING  Incomplete  Culture, blood (Routine x 2)  Status: None (Preliminary result)   Collection Time: 09/15/18  4:38 PM  Result Value Ref Range Status   Specimen Description LEFT ANTECUBITAL  Final   Special Requests   Final    BOTTLES DRAWN AEROBIC AND ANAEROBIC Blood Culture adequate volume   Culture   Final    NO GROWTH 3 DAYS Performed at [facility], [address] [place], [place] [address]    Report Status PENDING  Incomplete   Time coordinating discharge: 34 minutes   SIGNED:  Irwin Brakeman, MD  [facility] 09/18/2018, 1:09 PM Pager [phone]  If 7PM-7AM, please contact night-coverage [email or web address] Password [email or web address]

## 2018-09-20 LAB — CULTURE, BLOOD (ROUTINE X 2)
CULTURE: NO GROWTH
Culture: NO GROWTH
Special Requests: ADEQUATE
Special Requests: ADEQUATE

## 2018-09-24 DIAGNOSIS — J189 Pneumonia, unspecified organism: Secondary | ICD-10-CM | POA: Diagnosis not present

## 2018-09-24 DIAGNOSIS — J449 Chronic obstructive pulmonary disease, unspecified: Secondary | ICD-10-CM | POA: Diagnosis not present

## 2018-09-25 ENCOUNTER — Other Ambulatory Visit (HOSPITAL_COMMUNITY): Payer: Self-pay | Admitting: Family Medicine

## 2018-09-25 DIAGNOSIS — F17210 Nicotine dependence, cigarettes, uncomplicated: Secondary | ICD-10-CM

## 2018-10-19 DIAGNOSIS — J449 Chronic obstructive pulmonary disease, unspecified: Secondary | ICD-10-CM | POA: Diagnosis not present

## 2018-10-22 DIAGNOSIS — G894 Chronic pain syndrome: Secondary | ICD-10-CM | POA: Diagnosis not present

## 2018-10-22 DIAGNOSIS — M1A00X0 Idiopathic chronic gout, unspecified site, without tophus (tophi): Secondary | ICD-10-CM | POA: Diagnosis not present

## 2018-10-22 DIAGNOSIS — Z0000 Encounter for general adult medical examination without abnormal findings: Secondary | ICD-10-CM | POA: Diagnosis not present

## 2018-10-23 DIAGNOSIS — H40013 Open angle with borderline findings, low risk, bilateral: Secondary | ICD-10-CM | POA: Diagnosis not present

## 2018-10-23 DIAGNOSIS — H02839 Dermatochalasis of unspecified eye, unspecified eyelid: Secondary | ICD-10-CM | POA: Diagnosis not present

## 2018-10-24 DIAGNOSIS — R7309 Other abnormal glucose: Secondary | ICD-10-CM | POA: Diagnosis not present

## 2018-10-24 DIAGNOSIS — Z23 Encounter for immunization: Secondary | ICD-10-CM | POA: Diagnosis not present

## 2018-11-19 DIAGNOSIS — J449 Chronic obstructive pulmonary disease, unspecified: Secondary | ICD-10-CM | POA: Diagnosis not present

## 2018-11-21 DIAGNOSIS — J22 Unspecified acute lower respiratory infection: Secondary | ICD-10-CM | POA: Diagnosis not present

## 2018-12-18 DIAGNOSIS — J449 Chronic obstructive pulmonary disease, unspecified: Secondary | ICD-10-CM | POA: Diagnosis not present

## 2018-12-21 DIAGNOSIS — Z961 Presence of intraocular lens: Secondary | ICD-10-CM | POA: Diagnosis not present

## 2018-12-21 DIAGNOSIS — H35361 Drusen (degenerative) of macula, right eye: Secondary | ICD-10-CM | POA: Diagnosis not present

## 2018-12-21 DIAGNOSIS — H35033 Hypertensive retinopathy, bilateral: Secondary | ICD-10-CM | POA: Diagnosis not present

## 2018-12-21 DIAGNOSIS — H40013 Open angle with borderline findings, low risk, bilateral: Secondary | ICD-10-CM | POA: Diagnosis not present

## 2019-01-09 DIAGNOSIS — R7309 Other abnormal glucose: Secondary | ICD-10-CM | POA: Diagnosis not present

## 2019-01-09 DIAGNOSIS — Z0001 Encounter for general adult medical examination with abnormal findings: Secondary | ICD-10-CM | POA: Diagnosis not present

## 2019-01-18 DIAGNOSIS — J449 Chronic obstructive pulmonary disease, unspecified: Secondary | ICD-10-CM | POA: Diagnosis not present

## 2019-02-17 DIAGNOSIS — J449 Chronic obstructive pulmonary disease, unspecified: Secondary | ICD-10-CM | POA: Diagnosis not present

## 2019-03-20 DIAGNOSIS — J449 Chronic obstructive pulmonary disease, unspecified: Secondary | ICD-10-CM | POA: Diagnosis not present

## 2019-04-18 IMAGING — DX DG CHEST 2V
2 series · 2 of 2 positions shown · non-contrast
Comparison: PA and lateral chest x-ray June 02, 2015 and chest
CT scan of December 12, 2014.

CLINICAL DATA: Upper respiratory infection symptoms including cough
and congestion intermittently for the past month. Current smoker.

EXAM:
CHEST  2 VIEW

[chest pa]
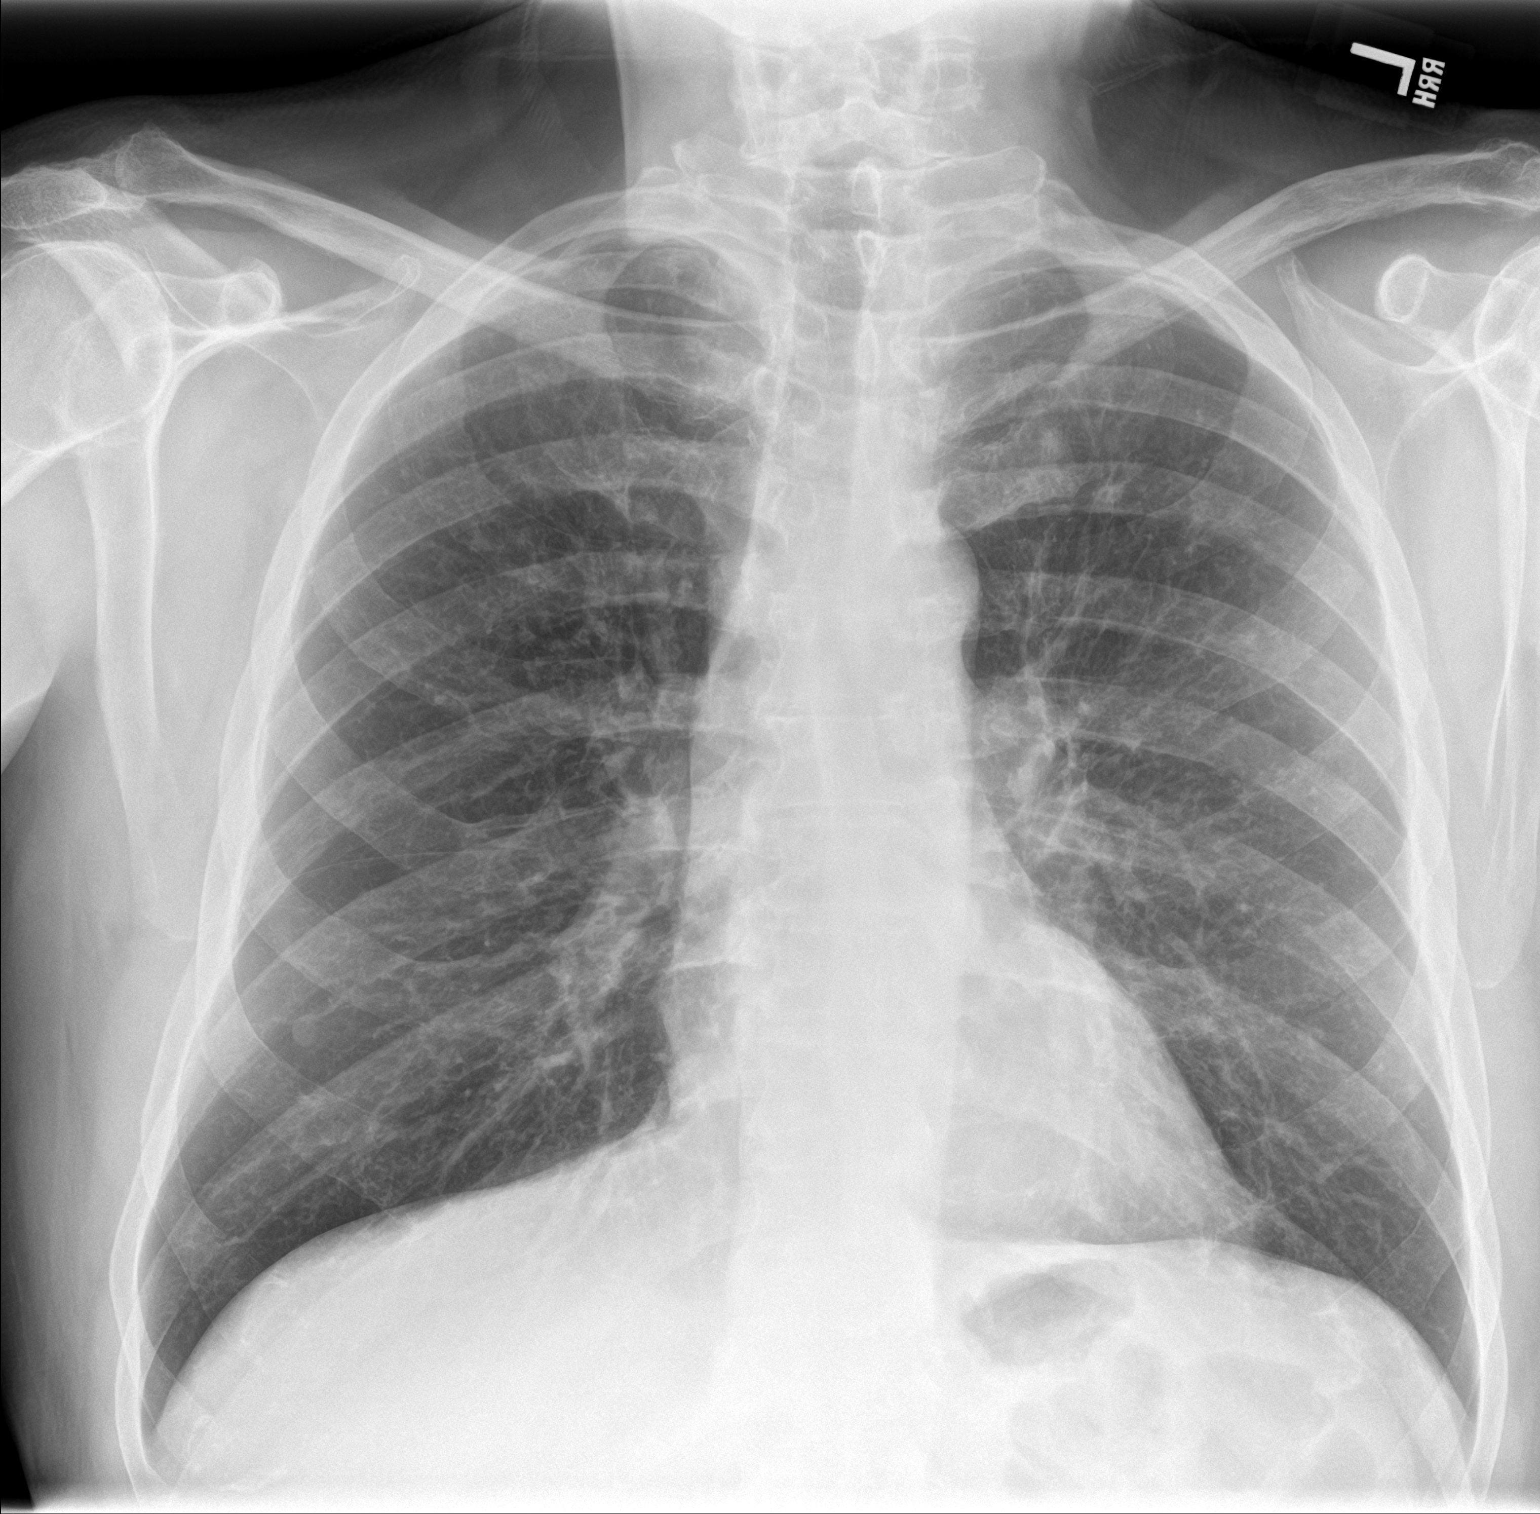

[chest lat]
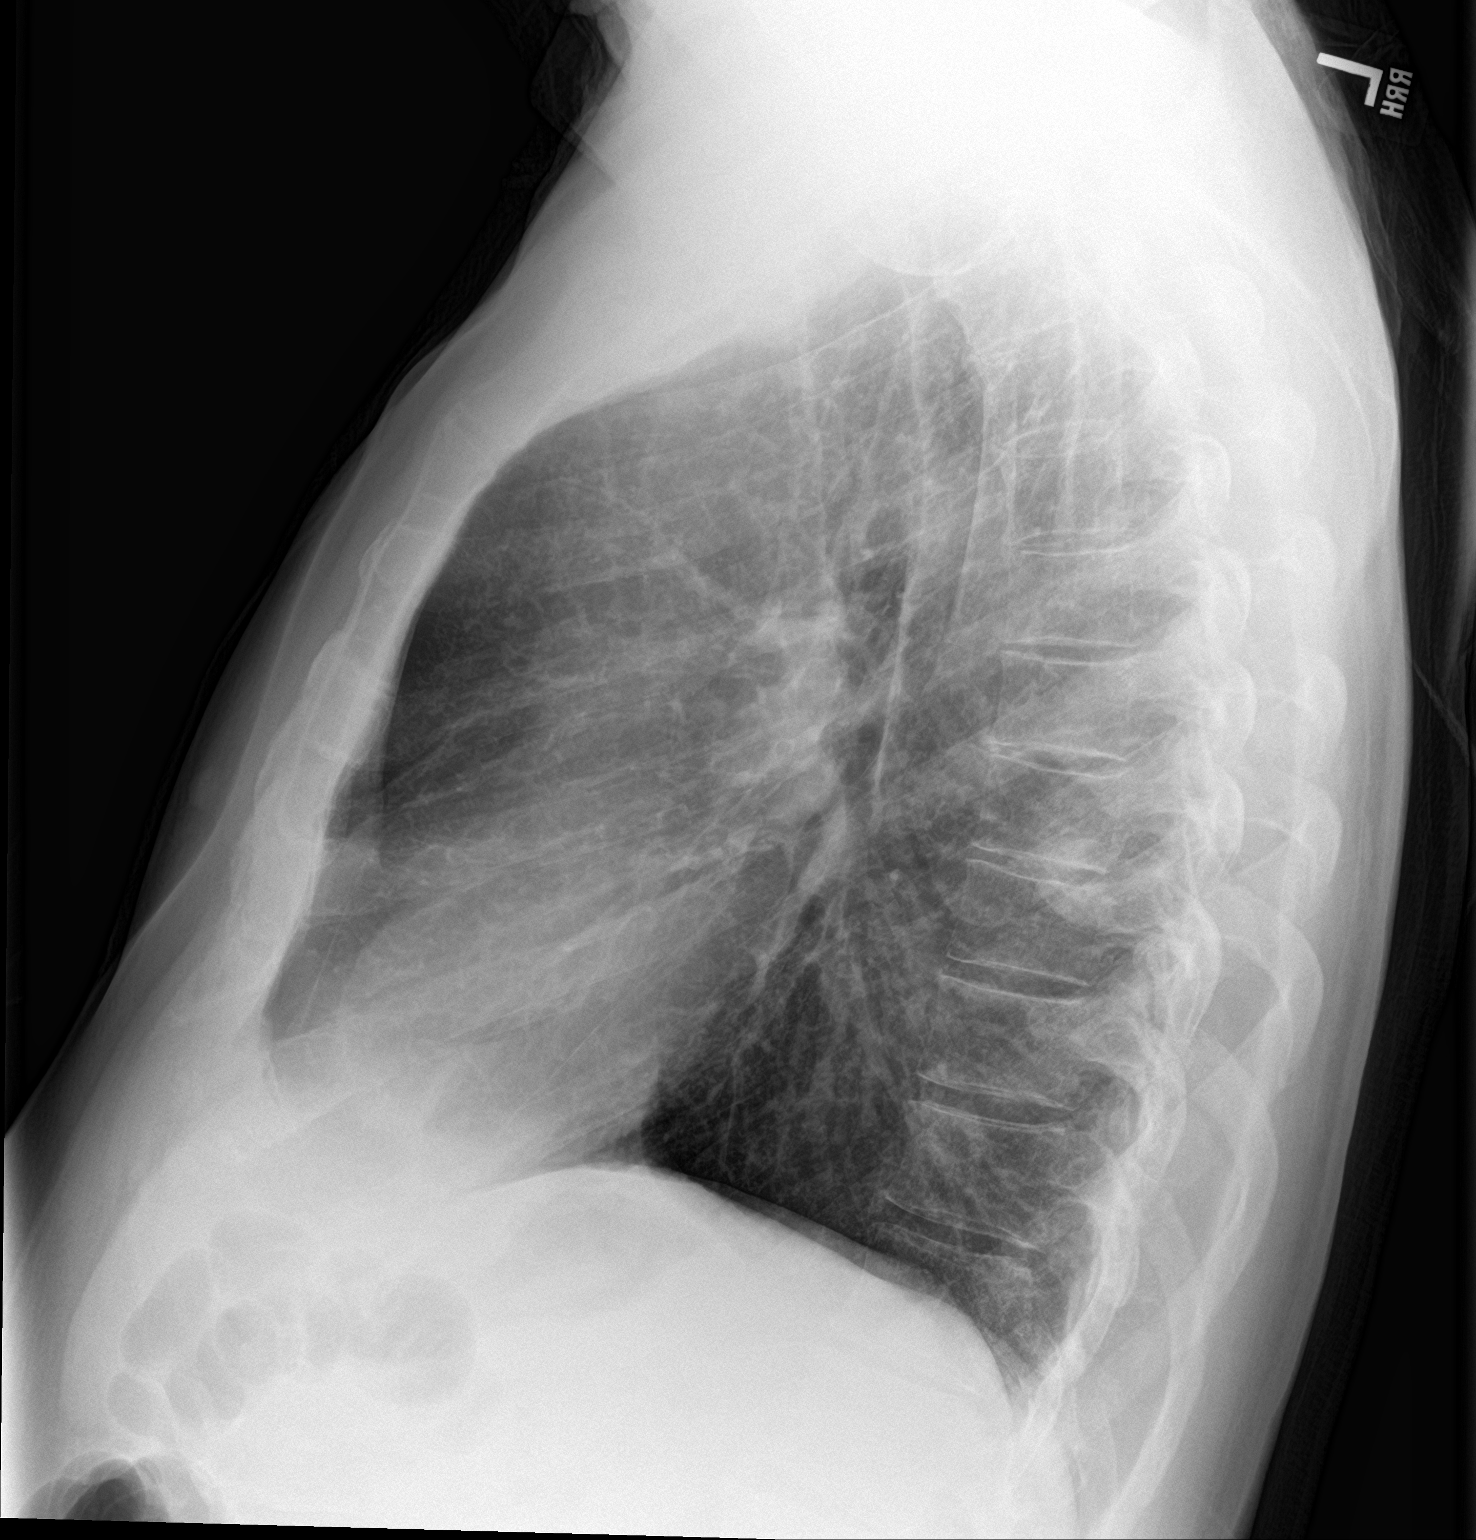

[2 of 2 positions shown; findings below may reference images not displayed]

FINDINGS: The lungs are well-expanded. The interstitial markings are coarse.
There is no alveolar infiltrate. There is stable density posteriorly
overlying the midthoracic spine that likely reflects degenerative
endplate spurring. The heart and pulmonary vascularity are normal.
The mediastinum is normal in width. There calcification in the wall
of the aortic arch. There is no pleural effusion.
IMPRESSION: Chronic bronchitic changes.  No alveolar pneumonia nor CHF.

Thoracic aortic atherosclerosis.

## 2019-04-19 DIAGNOSIS — J449 Chronic obstructive pulmonary disease, unspecified: Secondary | ICD-10-CM | POA: Diagnosis not present

## 2019-05-20 DIAGNOSIS — J449 Chronic obstructive pulmonary disease, unspecified: Secondary | ICD-10-CM | POA: Diagnosis not present

## 2019-06-10 DIAGNOSIS — R5381 Other malaise: Secondary | ICD-10-CM | POA: Diagnosis not present

## 2019-06-10 DIAGNOSIS — R5383 Other fatigue: Secondary | ICD-10-CM | POA: Diagnosis not present

## 2019-06-10 DIAGNOSIS — R509 Fever, unspecified: Secondary | ICD-10-CM | POA: Diagnosis not present

## 2019-06-10 DIAGNOSIS — R05 Cough: Secondary | ICD-10-CM | POA: Diagnosis not present

## 2019-06-20 DIAGNOSIS — J449 Chronic obstructive pulmonary disease, unspecified: Secondary | ICD-10-CM | POA: Diagnosis not present

## 2019-06-26 DIAGNOSIS — H02834 Dermatochalasis of left upper eyelid: Secondary | ICD-10-CM | POA: Diagnosis not present

## 2019-06-26 DIAGNOSIS — H02831 Dermatochalasis of right upper eyelid: Secondary | ICD-10-CM | POA: Diagnosis not present

## 2019-06-26 DIAGNOSIS — H02832 Dermatochalasis of right lower eyelid: Secondary | ICD-10-CM | POA: Diagnosis not present

## 2019-06-26 DIAGNOSIS — H40013 Open angle with borderline findings, low risk, bilateral: Secondary | ICD-10-CM | POA: Diagnosis not present

## 2019-07-19 DIAGNOSIS — Z23 Encounter for immunization: Secondary | ICD-10-CM | POA: Diagnosis not present

## 2019-09-20 DIAGNOSIS — G894 Chronic pain syndrome: Secondary | ICD-10-CM | POA: Diagnosis not present

## 2019-09-20 DIAGNOSIS — M1A00X0 Idiopathic chronic gout, unspecified site, without tophus (tophi): Secondary | ICD-10-CM | POA: Diagnosis not present

## 2019-09-20 DIAGNOSIS — J449 Chronic obstructive pulmonary disease, unspecified: Secondary | ICD-10-CM | POA: Diagnosis not present

## 2019-09-20 DIAGNOSIS — R945 Abnormal results of liver function studies: Secondary | ICD-10-CM | POA: Diagnosis not present

## 2019-09-20 DIAGNOSIS — E781 Pure hyperglyceridemia: Secondary | ICD-10-CM | POA: Diagnosis not present

## 2019-10-17 DIAGNOSIS — E781 Pure hyperglyceridemia: Secondary | ICD-10-CM | POA: Diagnosis not present

## 2019-10-17 DIAGNOSIS — J449 Chronic obstructive pulmonary disease, unspecified: Secondary | ICD-10-CM | POA: Diagnosis not present

## 2019-10-17 DIAGNOSIS — Z720 Tobacco use: Secondary | ICD-10-CM | POA: Diagnosis not present

## 2019-11-15 DIAGNOSIS — E039 Hypothyroidism, unspecified: Secondary | ICD-10-CM | POA: Diagnosis not present

## 2019-11-15 DIAGNOSIS — E781 Pure hyperglyceridemia: Secondary | ICD-10-CM | POA: Diagnosis not present

## 2019-11-15 DIAGNOSIS — M1A00X0 Idiopathic chronic gout, unspecified site, without tophus (tophi): Secondary | ICD-10-CM | POA: Diagnosis not present

## 2019-11-15 DIAGNOSIS — R945 Abnormal results of liver function studies: Secondary | ICD-10-CM | POA: Diagnosis not present

## 2019-11-15 DIAGNOSIS — E7849 Other hyperlipidemia: Secondary | ICD-10-CM | POA: Diagnosis not present

## 2019-12-15 DIAGNOSIS — E781 Pure hyperglyceridemia: Secondary | ICD-10-CM | POA: Diagnosis not present

## 2019-12-15 DIAGNOSIS — J449 Chronic obstructive pulmonary disease, unspecified: Secondary | ICD-10-CM | POA: Diagnosis not present

## 2019-12-15 DIAGNOSIS — Z720 Tobacco use: Secondary | ICD-10-CM | POA: Diagnosis not present

## 2019-12-20 ENCOUNTER — Ambulatory Visit: Payer: Medicare Other | Attending: Internal Medicine

## 2019-12-20 DIAGNOSIS — Z23 Encounter for immunization: Secondary | ICD-10-CM

## 2019-12-20 NOTE — Progress Notes (Signed)
   Covid-19 Vaccination Clinic  Name:  DATON GRAYER    MRN: [id number] DOB: [birth year]  12/20/2019  Mr. Ing was observed post Covid-19 immunization for 15 minutes without incident. He was provided with Vaccine Information Sheet and instruction to access the V-Safe system.   Mr. Viverito was instructed to call 911 with any severe reactions post vaccine: Marland Kitchen Difficulty breathing  . Swelling of face and throat  . A fast heartbeat  . A bad rash all over body  . Dizziness and weakness   Immunizations Administered    Name Date Dose VIS Date Route   Moderna COVID-19 Vaccine 12/20/2019  9:50 AM 0.5 mL 09/17/2019 Intramuscular   Manufacturer: Moderna   Lot: [id number]   [id number]

## 2019-12-27 DIAGNOSIS — Z961 Presence of intraocular lens: Secondary | ICD-10-CM | POA: Diagnosis not present

## 2019-12-27 DIAGNOSIS — H40013 Open angle with borderline findings, low risk, bilateral: Secondary | ICD-10-CM | POA: Diagnosis not present

## 2019-12-27 DIAGNOSIS — H26491 Other secondary cataract, right eye: Secondary | ICD-10-CM | POA: Diagnosis not present

## 2019-12-27 DIAGNOSIS — H35361 Drusen (degenerative) of macula, right eye: Secondary | ICD-10-CM | POA: Diagnosis not present

## 2020-01-09 DIAGNOSIS — R7309 Other abnormal glucose: Secondary | ICD-10-CM | POA: Diagnosis not present

## 2020-01-09 DIAGNOSIS — Z1389 Encounter for screening for other disorder: Secondary | ICD-10-CM | POA: Diagnosis not present

## 2020-01-09 DIAGNOSIS — Z0000 Encounter for general adult medical examination without abnormal findings: Secondary | ICD-10-CM | POA: Diagnosis not present

## 2020-01-21 ENCOUNTER — Ambulatory Visit: Payer: Medicare Other | Attending: Internal Medicine

## 2020-01-21 DIAGNOSIS — Z23 Encounter for immunization: Secondary | ICD-10-CM

## 2020-01-21 NOTE — Progress Notes (Signed)
   Covid-19 Vaccination Clinic  Name:  Cesar Gonzalez    MRN: [id number] DOB: July 13, 1951  01/21/2020  Mr. Cesar Gonzalez was observed post Covid-19 immunization for 15 minutes without incident. He was provided with Vaccine Information Sheet and instruction to access the V-Safe system.   Mr. Cesar Gonzalez was instructed to call 911 with any severe reactions post vaccine: Marland Kitchen Difficulty breathing  . Swelling of face and throat  . A fast heartbeat  . A bad rash all over body  . Dizziness and weakness   Immunizations Administered    Name Date Dose VIS Date Route   Moderna COVID-19 Vaccine 01/21/2020 10:41 AM 0.5 mL 09/17/2019 Intramuscular   Manufacturer: Moderna   LotHQ:[id number]   [place]DW:[id number]

## 2020-02-01 IMAGING — DX DG CHEST 1V PORT
1 series · 1 of 1 positions shown · non-contrast
Comparison: 11/30/2017 chest radiograph.

CLINICAL DATA: Dyspnea

EXAM:
PORTABLE CHEST 1 VIEW

[chest ap]
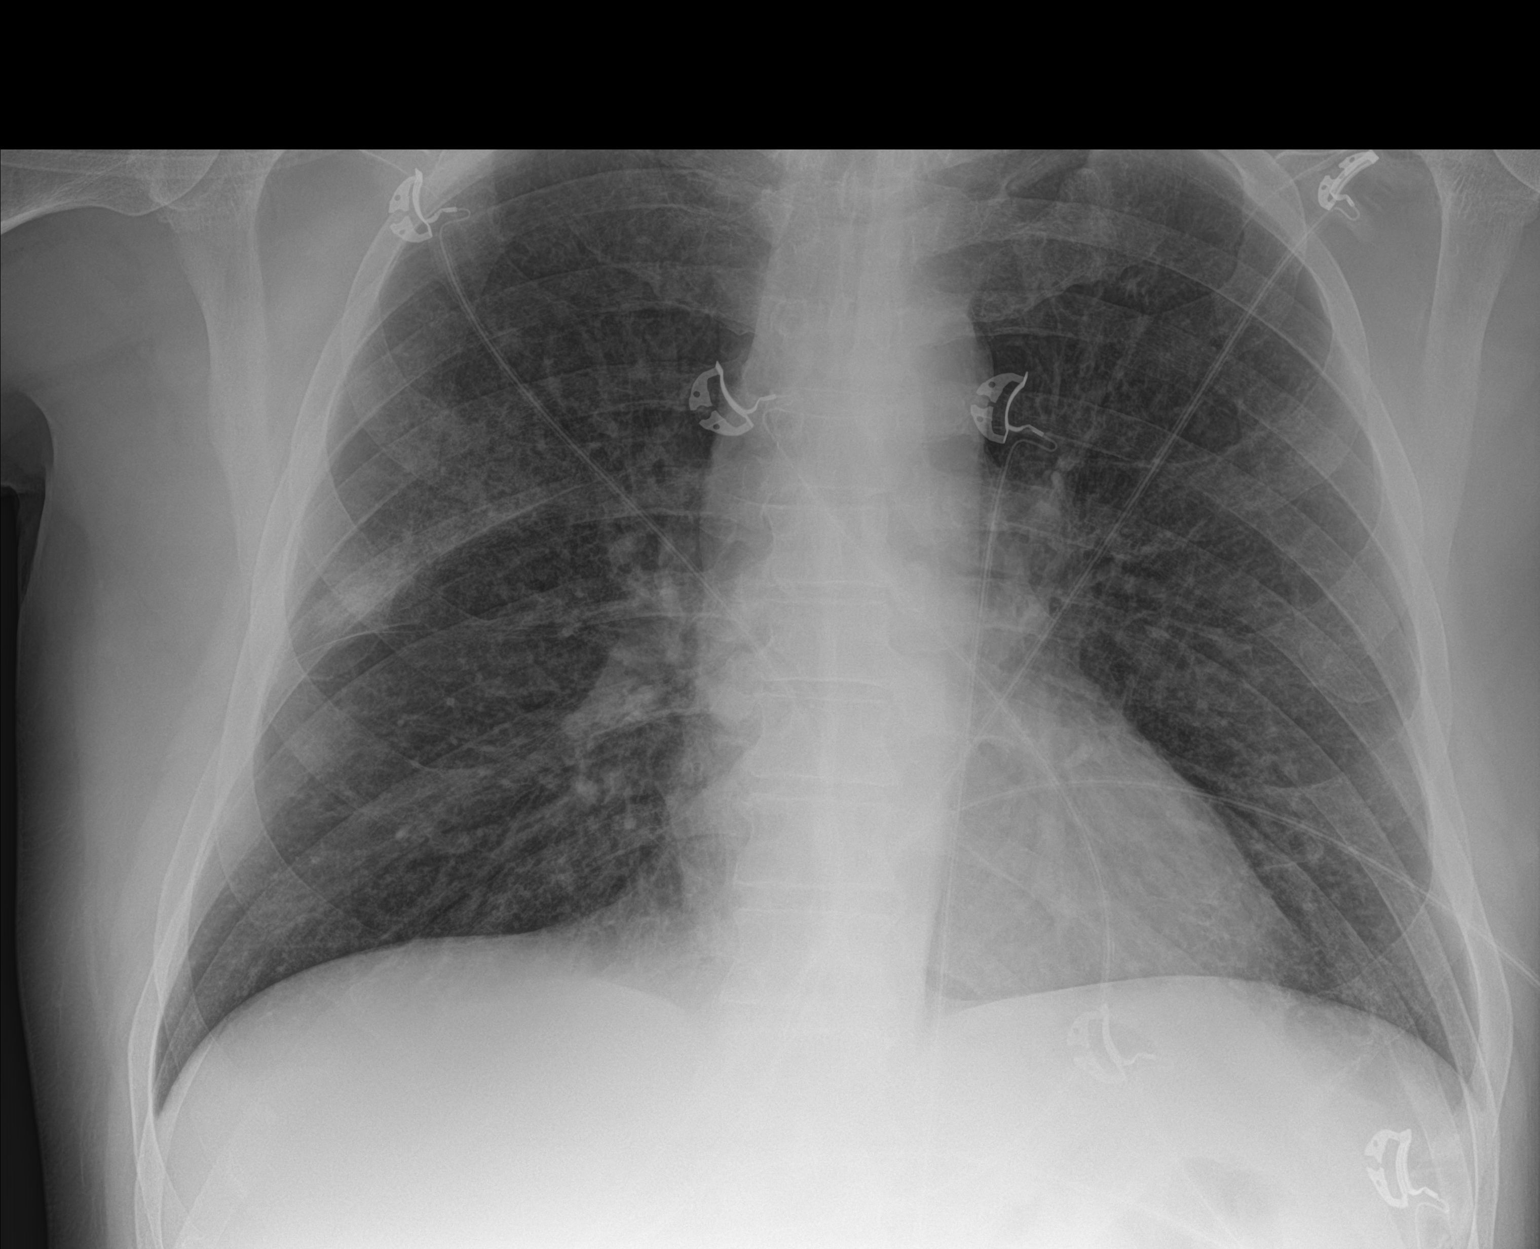

[1 of 1 positions shown; findings below may reference images not displayed]

FINDINGS: Stable cardiomediastinal silhouette with normal heart size. No
pneumothorax. No pleural effusion. Patchy peripheral right midlung
opacity is new.
IMPRESSION: New patchy peripheral right mid lung opacity suggestive of
developing pneumonia. Recommend follow-up chest radiographs to
document resolution.

## 2020-02-10 ENCOUNTER — Encounter (INDEPENDENT_AMBULATORY_CARE_PROVIDER_SITE_OTHER): Payer: Self-pay | Admitting: *Deleted

## 2020-02-14 DIAGNOSIS — J449 Chronic obstructive pulmonary disease, unspecified: Secondary | ICD-10-CM | POA: Diagnosis not present

## 2020-02-14 DIAGNOSIS — Z720 Tobacco use: Secondary | ICD-10-CM | POA: Diagnosis not present

## 2020-02-14 DIAGNOSIS — E781 Pure hyperglyceridemia: Secondary | ICD-10-CM | POA: Diagnosis not present

## 2020-03-10 DIAGNOSIS — R1310 Dysphagia, unspecified: Secondary | ICD-10-CM | POA: Diagnosis not present

## 2020-03-10 DIAGNOSIS — E7849 Other hyperlipidemia: Secondary | ICD-10-CM | POA: Diagnosis not present

## 2020-04-15 DIAGNOSIS — M1A00X0 Idiopathic chronic gout, unspecified site, without tophus (tophi): Secondary | ICD-10-CM | POA: Diagnosis not present

## 2020-04-15 DIAGNOSIS — J449 Chronic obstructive pulmonary disease, unspecified: Secondary | ICD-10-CM | POA: Diagnosis not present

## 2020-04-15 DIAGNOSIS — Z720 Tobacco use: Secondary | ICD-10-CM | POA: Diagnosis not present

## 2020-04-15 DIAGNOSIS — E7849 Other hyperlipidemia: Secondary | ICD-10-CM | POA: Diagnosis not present

## 2020-06-10 DIAGNOSIS — L309 Dermatitis, unspecified: Secondary | ICD-10-CM | POA: Diagnosis not present

## 2020-06-10 DIAGNOSIS — M1A00X0 Idiopathic chronic gout, unspecified site, without tophus (tophi): Secondary | ICD-10-CM | POA: Diagnosis not present

## 2020-06-16 DIAGNOSIS — M1A00X0 Idiopathic chronic gout, unspecified site, without tophus (tophi): Secondary | ICD-10-CM | POA: Diagnosis not present

## 2020-06-16 DIAGNOSIS — J449 Chronic obstructive pulmonary disease, unspecified: Secondary | ICD-10-CM | POA: Diagnosis not present

## 2020-06-16 DIAGNOSIS — Z720 Tobacco use: Secondary | ICD-10-CM | POA: Diagnosis not present

## 2020-06-16 DIAGNOSIS — E7849 Other hyperlipidemia: Secondary | ICD-10-CM | POA: Diagnosis not present

## 2020-07-03 DIAGNOSIS — R1311 Dysphagia, oral phase: Secondary | ICD-10-CM | POA: Diagnosis not present

## 2020-08-15 DIAGNOSIS — Z720 Tobacco use: Secondary | ICD-10-CM | POA: Diagnosis not present

## 2020-08-15 DIAGNOSIS — J449 Chronic obstructive pulmonary disease, unspecified: Secondary | ICD-10-CM | POA: Diagnosis not present

## 2020-08-15 DIAGNOSIS — E781 Pure hyperglyceridemia: Secondary | ICD-10-CM | POA: Diagnosis not present

## 2020-08-20 DIAGNOSIS — Z1159 Encounter for screening for other viral diseases: Secondary | ICD-10-CM | POA: Diagnosis not present

## 2020-08-25 DIAGNOSIS — D125 Benign neoplasm of sigmoid colon: Secondary | ICD-10-CM | POA: Diagnosis not present

## 2020-08-25 DIAGNOSIS — D122 Benign neoplasm of ascending colon: Secondary | ICD-10-CM | POA: Diagnosis not present

## 2020-08-25 DIAGNOSIS — K635 Polyp of colon: Secondary | ICD-10-CM | POA: Diagnosis not present

## 2020-08-25 DIAGNOSIS — D128 Benign neoplasm of rectum: Secondary | ICD-10-CM | POA: Diagnosis not present

## 2020-08-25 DIAGNOSIS — R1310 Dysphagia, unspecified: Secondary | ICD-10-CM | POA: Diagnosis not present

## 2020-08-25 DIAGNOSIS — K621 Rectal polyp: Secondary | ICD-10-CM | POA: Diagnosis not present

## 2020-08-25 DIAGNOSIS — D123 Benign neoplasm of transverse colon: Secondary | ICD-10-CM | POA: Diagnosis not present

## 2020-08-25 DIAGNOSIS — K2950 Unspecified chronic gastritis without bleeding: Secondary | ICD-10-CM | POA: Diagnosis not present

## 2020-08-25 DIAGNOSIS — D120 Benign neoplasm of cecum: Secondary | ICD-10-CM | POA: Diagnosis not present

## 2020-08-25 DIAGNOSIS — K3189 Other diseases of stomach and duodenum: Secondary | ICD-10-CM | POA: Diagnosis not present

## 2020-08-25 DIAGNOSIS — D124 Benign neoplasm of descending colon: Secondary | ICD-10-CM | POA: Diagnosis not present

## 2020-08-25 DIAGNOSIS — Z1211 Encounter for screening for malignant neoplasm of colon: Secondary | ICD-10-CM | POA: Diagnosis not present

## 2020-08-28 DIAGNOSIS — E781 Pure hyperglyceridemia: Secondary | ICD-10-CM | POA: Diagnosis not present

## 2020-08-28 DIAGNOSIS — J449 Chronic obstructive pulmonary disease, unspecified: Secondary | ICD-10-CM | POA: Diagnosis not present

## 2020-08-28 DIAGNOSIS — E7849 Other hyperlipidemia: Secondary | ICD-10-CM | POA: Diagnosis not present

## 2020-08-28 DIAGNOSIS — M1A00X0 Idiopathic chronic gout, unspecified site, without tophus (tophi): Secondary | ICD-10-CM | POA: Diagnosis not present

## 2020-08-28 DIAGNOSIS — R945 Abnormal results of liver function studies: Secondary | ICD-10-CM | POA: Diagnosis not present

## 2020-08-31 DIAGNOSIS — D122 Benign neoplasm of ascending colon: Secondary | ICD-10-CM | POA: Diagnosis not present

## 2020-08-31 DIAGNOSIS — D125 Benign neoplasm of sigmoid colon: Secondary | ICD-10-CM | POA: Diagnosis not present

## 2020-08-31 DIAGNOSIS — D120 Benign neoplasm of cecum: Secondary | ICD-10-CM | POA: Diagnosis not present

## 2020-08-31 DIAGNOSIS — D124 Benign neoplasm of descending colon: Secondary | ICD-10-CM | POA: Diagnosis not present

## 2020-08-31 DIAGNOSIS — D123 Benign neoplasm of transverse colon: Secondary | ICD-10-CM | POA: Diagnosis not present

## 2020-09-15 DIAGNOSIS — Z720 Tobacco use: Secondary | ICD-10-CM | POA: Diagnosis not present

## 2020-09-15 DIAGNOSIS — J449 Chronic obstructive pulmonary disease, unspecified: Secondary | ICD-10-CM | POA: Diagnosis not present

## 2020-09-15 DIAGNOSIS — E781 Pure hyperglyceridemia: Secondary | ICD-10-CM | POA: Diagnosis not present

## 2020-11-14 DIAGNOSIS — J449 Chronic obstructive pulmonary disease, unspecified: Secondary | ICD-10-CM | POA: Diagnosis not present

## 2020-11-14 DIAGNOSIS — Z720 Tobacco use: Secondary | ICD-10-CM | POA: Diagnosis not present

## 2020-11-14 DIAGNOSIS — E781 Pure hyperglyceridemia: Secondary | ICD-10-CM | POA: Diagnosis not present

## 2020-11-16 DIAGNOSIS — Z1389 Encounter for screening for other disorder: Secondary | ICD-10-CM | POA: Diagnosis not present

## 2020-11-16 DIAGNOSIS — J449 Chronic obstructive pulmonary disease, unspecified: Secondary | ICD-10-CM | POA: Diagnosis not present

## 2020-11-16 DIAGNOSIS — E7849 Other hyperlipidemia: Secondary | ICD-10-CM | POA: Diagnosis not present

## 2020-11-16 DIAGNOSIS — L309 Dermatitis, unspecified: Secondary | ICD-10-CM | POA: Diagnosis not present

## 2020-11-16 DIAGNOSIS — Z0001 Encounter for general adult medical examination with abnormal findings: Secondary | ICD-10-CM | POA: Diagnosis not present

## 2021-01-13 DIAGNOSIS — J449 Chronic obstructive pulmonary disease, unspecified: Secondary | ICD-10-CM | POA: Diagnosis not present

## 2021-01-13 DIAGNOSIS — Z720 Tobacco use: Secondary | ICD-10-CM | POA: Diagnosis not present

## 2021-01-13 DIAGNOSIS — E781 Pure hyperglyceridemia: Secondary | ICD-10-CM | POA: Diagnosis not present

## 2021-01-26 DIAGNOSIS — H35362 Drusen (degenerative) of macula, left eye: Secondary | ICD-10-CM | POA: Diagnosis not present

## 2021-01-26 DIAGNOSIS — H524 Presbyopia: Secondary | ICD-10-CM | POA: Diagnosis not present

## 2021-01-26 DIAGNOSIS — H26491 Other secondary cataract, right eye: Secondary | ICD-10-CM | POA: Diagnosis not present

## 2021-01-26 DIAGNOSIS — Z961 Presence of intraocular lens: Secondary | ICD-10-CM | POA: Diagnosis not present

## 2021-01-26 DIAGNOSIS — H40013 Open angle with borderline findings, low risk, bilateral: Secondary | ICD-10-CM | POA: Diagnosis not present

## 2021-02-25 DIAGNOSIS — B351 Tinea unguium: Secondary | ICD-10-CM | POA: Diagnosis not present

## 2021-02-25 DIAGNOSIS — L400 Psoriasis vulgaris: Secondary | ICD-10-CM | POA: Diagnosis not present

## 2021-03-16 DIAGNOSIS — E781 Pure hyperglyceridemia: Secondary | ICD-10-CM | POA: Diagnosis not present

## 2021-03-16 DIAGNOSIS — J449 Chronic obstructive pulmonary disease, unspecified: Secondary | ICD-10-CM | POA: Diagnosis not present

## 2021-05-07 DIAGNOSIS — E782 Mixed hyperlipidemia: Secondary | ICD-10-CM | POA: Diagnosis not present

## 2021-05-07 DIAGNOSIS — I10 Essential (primary) hypertension: Secondary | ICD-10-CM | POA: Diagnosis not present

## 2021-05-16 DIAGNOSIS — J449 Chronic obstructive pulmonary disease, unspecified: Secondary | ICD-10-CM | POA: Diagnosis not present

## 2021-05-16 DIAGNOSIS — E781 Pure hyperglyceridemia: Secondary | ICD-10-CM | POA: Diagnosis not present

## 2021-08-09 DIAGNOSIS — E7849 Other hyperlipidemia: Secondary | ICD-10-CM | POA: Diagnosis not present

## 2021-08-09 DIAGNOSIS — J449 Chronic obstructive pulmonary disease, unspecified: Secondary | ICD-10-CM | POA: Diagnosis not present

## 2021-08-09 DIAGNOSIS — Z23 Encounter for immunization: Secondary | ICD-10-CM | POA: Diagnosis not present

## 2021-08-09 DIAGNOSIS — E782 Mixed hyperlipidemia: Secondary | ICD-10-CM | POA: Diagnosis not present

## 2021-08-09 DIAGNOSIS — I10 Essential (primary) hypertension: Secondary | ICD-10-CM | POA: Diagnosis not present

## 2021-08-09 DIAGNOSIS — L309 Dermatitis, unspecified: Secondary | ICD-10-CM | POA: Diagnosis not present

## 2021-11-23 DIAGNOSIS — E782 Mixed hyperlipidemia: Secondary | ICD-10-CM | POA: Diagnosis not present

## 2021-11-23 DIAGNOSIS — Z0001 Encounter for general adult medical examination with abnormal findings: Secondary | ICD-10-CM | POA: Diagnosis not present

## 2021-11-23 DIAGNOSIS — R079 Chest pain, unspecified: Secondary | ICD-10-CM | POA: Diagnosis not present

## 2021-11-23 DIAGNOSIS — J449 Chronic obstructive pulmonary disease, unspecified: Secondary | ICD-10-CM | POA: Diagnosis not present

## 2021-11-23 DIAGNOSIS — E7849 Other hyperlipidemia: Secondary | ICD-10-CM | POA: Diagnosis not present

## 2021-11-23 DIAGNOSIS — I10 Essential (primary) hypertension: Secondary | ICD-10-CM | POA: Diagnosis not present

## 2021-12-01 DIAGNOSIS — J069 Acute upper respiratory infection, unspecified: Secondary | ICD-10-CM | POA: Diagnosis not present

## 2022-01-14 ENCOUNTER — Ambulatory Visit: Payer: Medicare Other | Admitting: Internal Medicine

## 2022-01-26 DIAGNOSIS — H02834 Dermatochalasis of left upper eyelid: Secondary | ICD-10-CM | POA: Diagnosis not present

## 2022-01-26 DIAGNOSIS — H43393 Other vitreous opacities, bilateral: Secondary | ICD-10-CM | POA: Diagnosis not present

## 2022-01-26 DIAGNOSIS — H02831 Dermatochalasis of right upper eyelid: Secondary | ICD-10-CM | POA: Diagnosis not present

## 2022-01-26 DIAGNOSIS — H40013 Open angle with borderline findings, low risk, bilateral: Secondary | ICD-10-CM | POA: Diagnosis not present

## 2022-01-26 DIAGNOSIS — H524 Presbyopia: Secondary | ICD-10-CM | POA: Diagnosis not present

## 2022-02-23 DIAGNOSIS — Z79899 Other long term (current) drug therapy: Secondary | ICD-10-CM | POA: Diagnosis not present

## 2022-02-23 DIAGNOSIS — L400 Psoriasis vulgaris: Secondary | ICD-10-CM | POA: Diagnosis not present

## 2022-03-07 ENCOUNTER — Ambulatory Visit (INDEPENDENT_AMBULATORY_CARE_PROVIDER_SITE_OTHER): Payer: Medicare Other | Admitting: Cardiology

## 2022-03-07 ENCOUNTER — Encounter: Payer: Self-pay | Admitting: Cardiology

## 2022-03-07 VITALS — BP 132/78 | HR 95 | Ht 74.0 in | Wt 237.0 lb

## 2022-03-07 DIAGNOSIS — R079 Chest pain, unspecified: Secondary | ICD-10-CM

## 2022-03-07 DIAGNOSIS — E782 Mixed hyperlipidemia: Secondary | ICD-10-CM

## 2022-03-07 DIAGNOSIS — Z87898 Personal history of other specified conditions: Secondary | ICD-10-CM

## 2022-03-07 DIAGNOSIS — I10 Essential (primary) hypertension: Secondary | ICD-10-CM | POA: Diagnosis not present

## 2022-03-07 DIAGNOSIS — R0789 Other chest pain: Secondary | ICD-10-CM

## 2022-03-07 NOTE — Patient Instructions (Addendum)
Medication Instructions:  Your physician recommends that you continue on your current medications as directed. Please refer to the Current Medication list given to you today.   Labwork: None today  Testing/Procedures: Your physician has requested that you have en exercise stress myoview. For further information please visit [email or web address]. Please follow instruction sheet, as given.   Follow-Up: After testing  Any Other Special Instructions Will Be Listed Below (If Applicable).  If you need a refill on your cardiac medications before your next appointment, please call your pharmacy.

## 2022-03-07 NOTE — Progress Notes (Signed)
Cardiology Office Note  Date: 03/07/2022   ID: Mekhai, Venuto 1950/11/27, MRN [id number]  PCP:  Redmond School, MD  Cardiologist:  Rozann Lesches, MD Electrophysiologist:  None   Chief Complaint  Patient presents with   Chest discomfort    History of Present Illness: Cesar Gonzalez is a 71 y.o. male referred for cardiology consultation by Dr. Hilma Favors for evaluation of chest discomfort.  He describes a left shoulder, anterior thoracic discomfort that is sporadic, not necessarily precipitated by exertion or moving his left arm.  This happened back in 2016 during which time he underwent evaluation through [facility] cardiology and had a normal exercise echocardiogram.  He started experiencing the symptoms again about 6 months ago, but has not had any recently.  He does have a history of hypertension and hyperlipidemia, currently on medical therapy for both.  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  He is a [place].  I personally reviewed his ECG today which shows sinus rhythm with prolonged PR interval.  Blood pressure rechecked by me in the left arm today was 132/78.  He reports compliance with his medications.  Past Medical History:  Diagnosis Date   Anxiety    COPD (chronic obstructive pulmonary disease) ([place]) 09/17/2018   Essential hypertension    Gout    Mixed hyperlipidemia    Nephritis    Pneumonia     Past Surgical History:  Procedure Laterality Date   TONSILLECTOMY      Current Outpatient Medications  Medication Sig Dispense Refill   ALPRAZolam (XANAX) 1 MG tablet Take 1 mg by mouth 3 (three) times daily as needed.     Apple Cider Vinegar 500 MG TABS daily.     budesonide-formoterol (SYMBICORT) 80-4.5 MCG/ACT inhaler 2 puffs in the morning and at bedtime.     colchicine 0.6 MG tablet Take 0.6 mg by mouth daily as needed (gout flare up).     Multiple Vitamins-Minerals (SUPER MULTI-VITAMIN PO) Take 1 Package by mouth  daily.     olmesartan (BENICAR) 40 MG tablet Take 40 mg by mouth daily.     Omega-3 Fatty Acids (FISH OIL) 1000 MG CAPS Take 1,000 mg by mouth daily.     rosuvastatin (CRESTOR) 5 MG tablet Take 5 mg by mouth daily.     albuterol (PROVENTIL HFA;VENTOLIN HFA) 108 (90 Base) MCG/ACT inhaler Inhale 2 puffs into the lungs every 4 (four) hours as needed for wheezing or shortness of breath. (Patient not taking: Reported on 03/07/2022) 1 Inhaler 0   ipratropium-albuterol (DUONEB) 0.5-2.5 (3) MG/3ML SOLN Take 3 mLs by nebulization every 6 (six) hours as needed (wheezing, cough, shortness of breath). (Patient not taking: Reported on 03/07/2022) 360 mL 0   No current facility-administered medications for this visit.   Allergies:  Nsaids   Social History: The patient  reports that he has quit smoking. His smoking use included cigarettes. He has never used smokeless tobacco. He reports current alcohol use. He reports that he does not use drugs.   Family History: The patient's family history includes COPD in his father; Diabetes in his mother; Heart attack in his maternal grandfather; Heart failure in his brother; Prostate cancer in his father; Stroke in his brother.   ROS: No palpitations or syncope.  Physical Exam: VS:  BP 132/78 (BP Location: Left Arm)   Pulse 95   Ht '6\' 2"'$  (1.88 m)   Wt 237 lb (107.5 kg)   SpO2 98%  BMI 30.43 kg/m , BMI Body mass index is 30.43 kg/m.  Wt Readings from Last 3 Encounters:  03/07/22 237 lb (107.5 kg)  09/16/18 208 lb 5.4 oz (94.5 kg)  12/12/14 200 lb (90.7 kg)    General: Patient appears comfortable at rest. HEENT: Conjunctiva and lids normal. Neck: Supple, no elevated JVP or carotid bruits. Lungs: Clear to auscultation, nonlabored breathing at rest. Cardiac: Regular rate and rhythm, no S3 or significant systolic murmur, no pericardial rub. Abdomen: Soft, nontender, bowel sounds present. Extremities: No pitting edema, distal pulses 2+. Skin: Warm and  dry. Musculoskeletal: No kyphosis. Neuropsychiatric: Alert and oriented x3, affect grossly appropriate.  ECG:  An ECG dated 09/15/2018 was personally reviewed today and demonstrated:  Sinus tachycardia.  Recent Labwork:  No recent lab work available for review.  Other Studies Reviewed Today:  No prior cardiac testing available for review today.  Assessment and Plan:  1.  History of recurrent left shoulder/anterior thoracic discomfort as described above in a 71 year old male with prior history of tobacco use (quit smoking about 2 years ago), hypertension, and hyperlipidemia.  ECG is nonspecific.  Plan is to proceed with follow-up ischemic evaluation via exercise Myoview.  Last cardiac testing was in 2016 through [facility] cardiology.  2.  Essential hypertension, currently on Benicar.  No changes were made today  3.  Mixed hyperlipidemia on Crestor.  No recent lab work from Dr. Hilma Favors to review today.  Medication Adjustments/Labs and Tests Ordered: Current medicines are reviewed at length with the patient today.  Concerns regarding medicines are outlined above.   Tests Ordered: Orders Placed This Encounter  Procedures   NM Myocar Multi W/Spect W/Wall Motion / EF   EKG 12-Lead    Medication Changes: No orders of the defined types were placed in this encounter.   Disposition:  Follow up  test results.  Signed, Satira Sark, MD, [facility] 03/07/2022 11:15 AM    [facility]. [address] [place], [place] [address] Phone: [phone]; Fax: [phone]

## 2022-03-18 ENCOUNTER — Encounter (HOSPITAL_COMMUNITY): Payer: Self-pay

## 2022-03-18 ENCOUNTER — Ambulatory Visit (HOSPITAL_COMMUNITY)
Admission: RE | Admit: 2022-03-18 | Discharge: 2022-03-18 | Disposition: A | Discharge status: Home / Self Care | Payer: Medicare Other | Source: Ambulatory Visit | Attending: Cardiology | Admitting: Cardiology

## 2022-03-18 ENCOUNTER — Encounter (HOSPITAL_COMMUNITY)
Admission: RE | Admit: 2022-03-18 | Discharge: 2022-03-18 | Disposition: A | Discharge status: Home / Self Care | Payer: Medicare Other | Source: Ambulatory Visit | Attending: Cardiology | Admitting: Cardiology

## 2022-03-18 DIAGNOSIS — R0789 Other chest pain: Secondary | ICD-10-CM | POA: Insufficient documentation

## 2022-03-18 LAB — NM MYOCAR MULTI W/SPECT W/WALL MOTION / EF
Angina Index: 0
Duke Treadmill Score: 4
Estimated workload: 7
Exercise duration (min): 4 min
Exercise duration (sec): 4 s
LV dias vol: 90 mL (ref 62–150)
LV sys vol: 32 mL
MPHR: 151 {beats}/min
Nuc Stress EF: 65 %
Peak HR: 139 {beats}/min
Percent HR: 92 %
RATE: 0.3
RPE: 16
Rest HR: 78 {beats}/min
Rest Nuclear Isotope Dose: 10.6 mCi
SDS: 1
SRS: 9
SSS: 10
ST Depression (mm): 0 mm
Stress Nuclear Isotope Dose: 31 mCi
TID: 0.97

## 2022-03-18 MED ORDER — TECHNETIUM TC 99M TETROFOSMIN IV KIT
30.0000 | PACK | Freq: Once | INTRAVENOUS | Status: AC | PRN
  Administered 2022-03-18: 31 via INTRAVENOUS

## 2022-03-18 MED ORDER — TECHNETIUM TC 99M TETROFOSMIN IV KIT
10.0000 | PACK | Freq: Once | INTRAVENOUS | Status: AC | PRN
  Administered 2022-03-18: 10.6 via INTRAVENOUS

## 2022-03-18 MED ORDER — REGADENOSON 0.4 MG/5ML IV SOLN
INTRAVENOUS | Status: AC
  Filled 2022-03-18: qty 5

## 2022-03-18 MED ORDER — SODIUM CHLORIDE FLUSH 0.9 % IV SOLN
INTRAVENOUS | Status: AC
  Administered 2022-03-18: 10 mL via INTRAVENOUS
  Filled 2022-03-18: qty 10

## 2022-05-30 DIAGNOSIS — L400 Psoriasis vulgaris: Secondary | ICD-10-CM | POA: Diagnosis not present

## 2022-05-30 DIAGNOSIS — D485 Neoplasm of uncertain behavior of skin: Secondary | ICD-10-CM | POA: Diagnosis not present

## 2022-05-30 DIAGNOSIS — B078 Other viral warts: Secondary | ICD-10-CM | POA: Diagnosis not present

## 2022-05-30 NOTE — Progress Notes (Deleted)
Cardiology Office Note    Date:  05/30/2022   ID:  Cesar Gonzalez, DOB 04/28/1951, MRN [id number]   PCP:  Sharilyn Sites, MD   [name]  Cardiologist:  None *** Advanced Practice Provider:  No care team member to display Electrophysiologist:  None   [id number]}   No chief complaint on file.   History of Present Illness:  Cesar Gonzalez is a 71 y.o. male with history of HTN, HLD patient who saw Dr. Domenic Polite 03/07/22 with chest pain similar to 2016 when he had a normal exercise echo at [facility]. NST 03/18/22 low risk no ischemia. To f/u prn.    Past Medical History:  Diagnosis Date   Anxiety    COPD (chronic obstructive pulmonary disease) ([place]) 09/17/2018   Essential hypertension    Gout    Mixed hyperlipidemia    Nephritis    Pneumonia     Past Surgical History:  Procedure Laterality Date   TONSILLECTOMY      Current Medications: No outpatient medications have been marked as taking for the 06/06/22 encounter (Appointment) with Cesar Burn, PA-C.     Allergies:   Patient has no active allergies.   Social History   Socioeconomic History   Marital status: Married    Spouse name: Not on file   Number of children: Not on file   Years of education: Not on file   Highest education level: Not on file  Occupational History   Not on file  Tobacco Use   Smoking status: Former    Types: Cigarettes   Smokeless tobacco: Never  Vaping Use   Vaping Use: Never used  Substance and Sexual Activity   Alcohol use: Yes    Comment: Occasional   Drug use: No   Sexual activity: Yes    Birth control/protection: None  Other Topics Concern   Not on file  Social History Narrative   Not on file   Social Determinants of Health   Financial Resource Strain: Not on file  Food Insecurity: Not on file  Transportation Needs: Not on file  Physical Activity: Not on file  Stress: Not on file  Social Connections: Not on file     Family  History:  The patient's ***family history includes COPD in his father; Diabetes in his mother; Heart attack in his maternal grandfather; Heart failure in his brother; Prostate cancer in his father; Stroke in his brother.   ROS:   Please see the history of present illness.    ROS All other systems reviewed and are negative.   PHYSICAL EXAM:   VS:  There were no vitals taken for this visit.  Physical Exam  GEN: Well nourished, well developed, in no acute distress  HEENT: normal  Neck: no JVD, carotid bruits, or masses Cardiac:RRR; no murmurs, rubs, or gallops  Respiratory:  clear to auscultation bilaterally, normal work of breathing GI: soft, nontender, nondistended, + BS Ext: without cyanosis, clubbing, or edema, Good distal pulses bilaterally MS: no deformity or atrophy  Skin: warm and dry, no rash Neuro:  Alert and Oriented x 3, Strength and sensation are intact Psych: euthymic mood, full affect  Wt Readings from Last 3 Encounters:  03/07/22 237 lb (107.5 kg)  09/16/18 208 lb 5.4 oz (94.5 kg)  12/12/14 200 lb (90.7 kg)      Studies/Labs Reviewed:   EKG:  EKG is*** ordered today.  The ekg ordered today demonstrates ***  Recent Labs: No results  found for requested labs within last 365 days.   Lipid Panel No results found for: "CHOL", "TRIG", "HDL", "CHOLHDL", "VLDL", "LDLCALC", "LDLDIRECT"  Additional studies/ records that were reviewed today include:  NST 03/2022  The study is normal. The study is low risk.   Fair exercise capacity, achieved 7.0 METS   Peak heart rate 139 bpm (92% max age predicted HR)   No ST deviation was noted.   LV perfusion is abnormal. There is no evidence of ischemia. There is no evidence of infarction. Defect 1: There is a medium defect with mild reduction in uptake present in the apical to basal inferior and inferoseptal location(s) that is fixed. There is normal wall motion in the defect area. Consistent with artifact caused by diaphragmatic  attenuation.   Left ventricular function is normal. End diastolic cavity size is normal. End systolic cavity size is normal.   Fixed inferior/inferoseptal perfusion defect with normal wall motion, consistent with artifact Low risk study     Risk Assessment/Calculations:   {Does this patient have ATRIAL FIBRILLATION?:[id number]}     ASSESSMENT:    1. History of chest pain   2. Essential hypertension   3. Hyperlipidemia, unspecified hyperlipidemia type      PLAN:  In order of problems listed above:  Chest pain low risk NST 03/2022 f/u prn  HTN  HLD  Shared Decision Making/Informed Consent   {Are you ordering a CV Procedure (e.g. stress test, cath, DCCV, TEE, etc)?   Press F2        :[id number]}    Medication Adjustments/Labs and Tests Ordered: Current medicines are reviewed at length with the patient today.  Concerns regarding medicines are outlined above.  Medication changes, Labs and Tests ordered today are listed in the Patient Instructions below. There are no Patient Instructions on file for this visit.   Sumner Boast, PA-C  05/30/2022 12:19 PM    [facility] [address], [place], [place]  [address] Phone: [phone]; Fax: [phone]

## 2022-06-06 ENCOUNTER — Ambulatory Visit: Payer: Medicare Other | Admitting: Physician Assistant

## 2022-06-06 DIAGNOSIS — I10 Essential (primary) hypertension: Secondary | ICD-10-CM

## 2022-06-06 DIAGNOSIS — Z87898 Personal history of other specified conditions: Secondary | ICD-10-CM

## 2022-06-06 DIAGNOSIS — E785 Hyperlipidemia, unspecified: Secondary | ICD-10-CM

## 2022-08-25 DIAGNOSIS — E7849 Other hyperlipidemia: Secondary | ICD-10-CM | POA: Diagnosis not present

## 2022-08-25 DIAGNOSIS — Z23 Encounter for immunization: Secondary | ICD-10-CM | POA: Diagnosis not present

## 2022-08-25 DIAGNOSIS — E782 Mixed hyperlipidemia: Secondary | ICD-10-CM | POA: Diagnosis not present

## 2022-08-25 DIAGNOSIS — I10 Essential (primary) hypertension: Secondary | ICD-10-CM | POA: Diagnosis not present

## 2022-08-25 DIAGNOSIS — J449 Chronic obstructive pulmonary disease, unspecified: Secondary | ICD-10-CM | POA: Diagnosis not present

## 2022-10-02 ENCOUNTER — Ambulatory Visit
Admission: RE | Admit: 2022-10-02 | Discharge: 2022-10-02 | Disposition: A | Discharge status: Home / Self Care | Payer: Medicare Other | Source: Ambulatory Visit | Attending: Family Medicine | Admitting: Family Medicine

## 2022-10-02 VITALS — BP 132/77 | HR 88 | Temp 98.6°F | Resp 16

## 2022-10-02 DIAGNOSIS — J441 Chronic obstructive pulmonary disease with (acute) exacerbation: Secondary | ICD-10-CM | POA: Insufficient documentation

## 2022-10-02 DIAGNOSIS — U071 COVID-19: Secondary | ICD-10-CM | POA: Diagnosis not present

## 2022-10-02 DIAGNOSIS — J069 Acute upper respiratory infection, unspecified: Secondary | ICD-10-CM | POA: Diagnosis not present

## 2022-10-02 LAB — RESP PANEL BY RT-PCR (FLU A&B, COVID) ARPGX2
Influenza A by PCR: NEGATIVE
Influenza B by PCR: NEGATIVE
SARS Coronavirus 2 by RT PCR: POSITIVE — AB

## 2022-10-02 MED ORDER — PREDNISONE 20 MG PO TABS: 40.0000 mg | ORAL_TABLET | Freq: Every day | ORAL | 0 refills | Status: DC

## 2022-10-02 MED ORDER — PROMETHAZINE-DM 6.25-15 MG/5ML PO SYRP: 5.0000 mL | ORAL_SOLUTION | Freq: Four times a day (QID) | ORAL | 0 refills | Status: DC | PRN

## 2022-10-02 MED ORDER — GUAIFENESIN ER 600 MG PO TB12: 600.0000 mg | ORAL_TABLET | Freq: Two times a day (BID) | ORAL | 0 refills | Status: DC

## 2022-10-02 NOTE — Discharge Instructions (Signed)
We have tested you for COVID and flu today and these results should return tomorrow morning.  Someone will call if you are positive for either 1 of these and discuss antiviral medications.  In the meantime, I have sent over some medications to help with your symptoms in addition to your inhaler regimen

## 2022-10-02 NOTE — ED Triage Notes (Signed)
Cough,  congestion, headache, fever 100 at home. Taking tylenol to help with fever, last dose this morning at 0830.

## 2022-10-02 NOTE — ED Provider Notes (Signed)
[facility]    CSN: [id number] Arrival date & time: 10/02/22  1053      History   Chief Complaint Chief Complaint  Patient presents with   Fever    Fever is low grade, ~100 F and varies lower. Some congestion, productive coughing, and chest tightness. - Entered by patient   Nasal Congestion   Headache   Cough    HPI LONN IM is a 71 y.o. male.   Patient presenting today with 3-day history of fever, cough, congestion, headache, fatigue, chest tightness, wheezing.  Denies chest pain, shortness of breath, abdominal pain, nausea vomiting or diarrhea.  So far using his home inhaler regimen for COPD and Tylenol with mild relief of symptoms.  No known sick contacts recently.  History of COPD and pneumonia.    Past Medical History:  Diagnosis Date   Anxiety    COPD (chronic obstructive pulmonary disease) ([place]) 09/17/2018   Essential hypertension    Gout    Mixed hyperlipidemia    Nephritis    Pneumonia     Patient Active Problem List   Diagnosis Date Noted   COPD (chronic obstructive pulmonary disease) ([place]) 09/17/2018   COPD exacerbation ([place]) 09/17/2018   Acute respiratory failure with hypoxia ([place]) 09/15/2018   Sepsis ([place]) 09/15/2018   Tobacco abuse 09/15/2018   CAP (community acquired pneumonia) 12/12/2014   Community acquired pneumonia 12/12/2014    Past Surgical History:  Procedure Laterality Date   TONSILLECTOMY         Home Medications    Prior to Admission medications   Medication Sig Start Date End Date Taking? Authorizing Provider  ALPRAZolam Duanne Moron) 1 MG tablet Take 1 mg by mouth 3 (three) times daily as needed. 02/26/22  Yes [provider]  Apple Cider Vinegar 500 MG TABS daily.   Yes [provider]  aspirin EC 81 MG tablet Take 81 mg by mouth daily. Swallow whole.   Yes [provider]  budesonide-formoterol (SYMBICORT) 80-4.5 MCG/ACT inhaler 2 puffs in the morning and at bedtime.   Yes [provider]  guaiFENesin (MUCINEX) 600 MG 12 hr tablet Take 1 tablet (600 mg total) by mouth 2 (two) times daily. 10/02/22  Yes Volney American, PA-C  Multiple Vitamins-Minerals (SUPER MULTI-VITAMIN PO) Take 1 Package by mouth daily.   Yes [provider]  olmesartan (BENICAR) 40 MG tablet Take 40 mg by mouth daily.   Yes [provider]  Omega-3 Fatty Acids (FISH OIL) 1000 MG CAPS Take 1,000 mg by mouth daily.   Yes [provider]  predniSONE (DELTASONE) 20 MG tablet Take 2 tablets (40 mg total) by mouth daily with breakfast. 10/02/22  Yes Volney American, PA-C  promethazine-dextromethorphan (PROMETHAZINE-DM) 6.25-15 MG/5ML syrup Take 5 mLs by mouth 4 (four) times daily as needed. 10/02/22  Yes Volney American, PA-C  rosuvastatin (CRESTOR) 5 MG tablet Take 5 mg by mouth daily.   Yes [provider]  Turmeric-Ginger-Black Pepper 125-6-50 MG-MG-MCG CHEW Chew 50 1e11 Vector Genomes by mouth once.   Yes [provider]  albuterol (PROVENTIL HFA;VENTOLIN HFA) 108 (90 Base) MCG/ACT inhaler Inhale 2 puffs into the lungs every 4 (four) hours as needed for wheezing or shortness of breath. 09/18/18   Johnson, Clanford L, MD  colchicine 0.6 MG tablet Take 0.6 mg by mouth daily as needed (gout flare up).    [provider]  ipratropium-albuterol (DUONEB) 0.5-2.5 (3) MG/3ML SOLN Take 3 mLs by nebulization every  6 (six) hours as needed (wheezing, cough, shortness of breath). Patient not taking: Reported on 03/07/2022 09/18/18   Murlean Iba, MD    Family History Family History  Problem Relation Age of Onset   Diabetes Mother    COPD Father    Prostate cancer Father    Stroke Brother    Heart failure Brother    Heart attack Maternal Grandfather     Social History Social History   Tobacco Use   Smoking status: Former    Types: Cigarettes   Smokeless tobacco: Never  Vaping Use   Vaping Use: Never used  Substance Use  Topics   Alcohol use: Yes    Comment: Occasional   Drug use: No     Allergies   Patient has no active allergies.   Review of Systems Review of Systems Per HPI  Physical Exam Triage Vital Signs ED Triage Vitals [10/02/22 1131]  Enc Vitals Group     BP 132/77     Pulse Rate 88     Resp 16     Temp 98.6 F (37 C)     Temp Source Oral     SpO2 94 %     Weight      Height      Head Circumference      Peak Flow      Pain Score 0     Pain Loc      Pain Edu?      Excl. in [place]?    No data found.  Updated Vital Signs BP 132/77 (BP Location: Right Arm)   Pulse 88   Temp 98.6 F (37 C) (Oral)   Resp 16   SpO2 94%   Visual Acuity Right Eye Distance:   Left Eye Distance:   Bilateral Distance:    Right Eye Near:   Left Eye Near:    Bilateral Near:     Physical Exam Vitals and nursing note reviewed.  Constitutional:      Appearance: He is well-developed.  HENT:     Head: Atraumatic.     Right Ear: External ear normal.     Left Ear: External ear normal.     Nose: Rhinorrhea present.     Mouth/Throat:     Mouth: Mucous membranes are moist.     Pharynx: Oropharynx is clear. No oropharyngeal exudate or posterior oropharyngeal erythema.  Eyes:     Conjunctiva/sclera: Conjunctivae normal.     Pupils: Pupils are equal, round, and reactive to light.  Cardiovascular:     Rate and Rhythm: Normal rate and regular rhythm.  Pulmonary:     Effort: Pulmonary effort is normal. No respiratory distress.     Breath sounds: Wheezing present. No rales.  Musculoskeletal:        General: Normal range of motion.     Cervical back: Normal range of motion and neck supple.  Lymphadenopathy:     Cervical: No cervical adenopathy.  Skin:    General: Skin is warm and dry.  Neurological:     Mental Status: He is alert and oriented to person, place, and time.  Psychiatric:        Behavior: Behavior normal.      UC Treatments / Results  Labs (all labs ordered are listed, but  only abnormal results are displayed) Labs Reviewed  RESP PANEL BY RT-PCR (FLU A&B, COVID) ARPGX2    EKG   Radiology No results found.  Procedures Procedures (including critical care time)  Medications Ordered in UC Medications - No data to display  Initial Impression / Assessment and Plan / UC Course  I have reviewed the triage vital signs and the nursing notes.  Pertinent labs & imaging results that were available during my care of the patient were reviewed by me and considered in my medical decision making (see chart for details).     Vital signs and exam overall reassuring and suspicious for viral respiratory infection causing a COPD exacerbation.  Respiratory panel pending, good candidate for antiviral therapy if positive for either 1.  Will treat with prednisone, Phenergan DM, Mucinex and continue inhaler regimen for COPD exacerbation.  Return for any worsening symptoms.  Final Clinical Impressions(s) / UC Diagnoses   Final diagnoses:  Viral URI with cough  COPD exacerbation ([place])     Discharge Instructions      We have tested you for COVID and flu today and these results should return tomorrow morning.  Someone will call if you are positive for either 1 of these and discuss antiviral medications.  In the meantime, I have sent over some medications to help with your symptoms in addition to your inhaler regimen    ED Prescriptions     Medication Sig Dispense Auth. Provider   predniSONE (DELTASONE) 20 MG tablet Take 2 tablets (40 mg total) by mouth daily with breakfast. 10 tablet Volney American, PA-C   promethazine-dextromethorphan (PROMETHAZINE-DM) 6.25-15 MG/5ML syrup Take 5 mLs by mouth 4 (four) times daily as needed. 100 mL Volney American, PA-C   guaiFENesin (MUCINEX) 600 MG 12 hr tablet Take 1 tablet (600 mg total) by mouth 2 (two) times daily. 20 tablet Volney American, [place]      PDMP not reviewed this encounter.   Volney American, [place] 10/02/22 1204

## 2022-10-03 ENCOUNTER — Telehealth (HOSPITAL_COMMUNITY): Payer: Self-pay | Admitting: Emergency Medicine

## 2022-10-03 MED ORDER — NIRMATRELVIR/RITONAVIR (PAXLOVID)TABLET: 3.0000 | ORAL_TABLET | Freq: Two times a day (BID) | ORAL | 0 refills | Status: DC

## 2022-10-04 ENCOUNTER — Telehealth: Payer: Self-pay | Admitting: Emergency Medicine

## 2022-10-04 DIAGNOSIS — U071 COVID-19: Secondary | ICD-10-CM | POA: Diagnosis not present

## 2022-10-04 MED ORDER — NIRMATRELVIR/RITONAVIR (PAXLOVID)TABLET: 3.0000 | ORAL_TABLET | Freq: Two times a day (BID) | ORAL | 0 refills | Status: AC

## 2022-10-04 MED ORDER — NIRMATRELVIR/RITONAVIR (PAXLOVID)TABLET: 3.0000 | ORAL_TABLET | Freq: Two times a day (BID) | ORAL | 0 refills | Status: DC

## 2022-10-11 ENCOUNTER — Emergency Department (HOSPITAL_COMMUNITY): Payer: Medicare Other

## 2022-10-11 ENCOUNTER — Emergency Department (HOSPITAL_COMMUNITY)
Admission: EM | Admit: 2022-10-11 | Discharge: 2022-10-11 | Disposition: A | Discharge status: Home / Self Care | Payer: Medicare Other | Attending: Emergency Medicine | Admitting: Emergency Medicine

## 2022-10-11 ENCOUNTER — Encounter (HOSPITAL_COMMUNITY): Payer: Self-pay

## 2022-10-11 ENCOUNTER — Other Ambulatory Visit: Payer: Self-pay

## 2022-10-11 DIAGNOSIS — J449 Chronic obstructive pulmonary disease, unspecified: Secondary | ICD-10-CM | POA: Insufficient documentation

## 2022-10-11 DIAGNOSIS — Z7982 Long term (current) use of aspirin: Secondary | ICD-10-CM | POA: Insufficient documentation

## 2022-10-11 DIAGNOSIS — Z7951 Long term (current) use of inhaled steroids: Secondary | ICD-10-CM | POA: Insufficient documentation

## 2022-10-11 DIAGNOSIS — R7989 Other specified abnormal findings of blood chemistry: Secondary | ICD-10-CM | POA: Diagnosis not present

## 2022-10-11 DIAGNOSIS — R0602 Shortness of breath: Secondary | ICD-10-CM | POA: Diagnosis not present

## 2022-10-11 DIAGNOSIS — U071 COVID-19: Secondary | ICD-10-CM

## 2022-10-11 DIAGNOSIS — Z8616 Personal history of COVID-19: Secondary | ICD-10-CM | POA: Diagnosis not present

## 2022-10-11 DIAGNOSIS — R509 Fever, unspecified: Secondary | ICD-10-CM | POA: Diagnosis present

## 2022-10-11 DIAGNOSIS — Z7952 Long term (current) use of systemic steroids: Secondary | ICD-10-CM | POA: Diagnosis not present

## 2022-10-11 DIAGNOSIS — F17200 Nicotine dependence, unspecified, uncomplicated: Secondary | ICD-10-CM | POA: Insufficient documentation

## 2022-10-11 DIAGNOSIS — D72829 Elevated white blood cell count, unspecified: Secondary | ICD-10-CM | POA: Diagnosis not present

## 2022-10-11 LAB — CBC WITH DIFFERENTIAL/PLATELET
Abs Immature Granulocytes: 0.09 10*3/uL — ABNORMAL HIGH (ref 0.00–0.07)
Basophils Absolute: 0 10*3/uL (ref 0.0–0.1)
Basophils Relative: 0 %
Eosinophils Absolute: 0.2 10*3/uL (ref 0.0–0.5)
Eosinophils Relative: 2 %
HCT: 42.8 % (ref 39.0–52.0)
Hemoglobin: 14.4 g/dL (ref 13.0–17.0)
Immature Granulocytes: 1 %
Lymphocytes Relative: 18 %
Lymphs Abs: 2.1 10*3/uL (ref 0.7–4.0)
MCH: 30.8 pg (ref 26.0–34.0)
MCHC: 33.6 g/dL (ref 30.0–36.0)
MCV: 91.6 fL (ref 80.0–100.0)
Monocytes Absolute: 1 10*3/uL (ref 0.1–1.0)
Monocytes Relative: 9 %
Neutro Abs: 8.4 10*3/uL — ABNORMAL HIGH (ref 1.7–7.7)
Neutrophils Relative %: 70 %
Platelets: 338 10*3/uL (ref 150–400)
RBC: 4.67 MIL/uL (ref 4.22–5.81)
RDW: 11.7 % (ref 11.5–15.5)
WBC: 11.9 10*3/uL — ABNORMAL HIGH (ref 4.0–10.5)
nRBC: 0 % (ref 0.0–0.2)

## 2022-10-11 LAB — BASIC METABOLIC PANEL
Anion gap: 9 (ref 5–15)
BUN: 29 mg/dL — ABNORMAL HIGH (ref 8–23)
CO2: 32 mmol/L (ref 22–32)
Calcium: 10 mg/dL (ref 8.9–10.3)
Chloride: 96 mmol/L — ABNORMAL LOW (ref 98–111)
Creatinine, Ser: 1.25 mg/dL — ABNORMAL HIGH (ref 0.61–1.24)
GFR, Estimated: >60 mL/min (ref >60)
Glucose, Bld: 99 mg/dL (ref 70–99)
Potassium: 5 mmol/L (ref 3.5–5.1)
Sodium: 137 mmol/L (ref 135–145)

## 2022-10-11 LAB — RESP PANEL BY RT-PCR (RSV, FLU A&B, COVID)  RVPGX2
Influenza A by PCR: NEGATIVE
Influenza B by PCR: NEGATIVE
Resp Syncytial Virus by PCR: NEGATIVE
SARS Coronavirus 2 by RT PCR: POSITIVE — AB

## 2022-10-11 MED ORDER — ALBUTEROL SULFATE HFA 108 (90 BASE) MCG/ACT IN AERS
2.0000 | INHALATION_SPRAY | Freq: Once | RESPIRATORY_TRACT | Status: AC
  Administered 2022-10-11: 2 via RESPIRATORY_TRACT
  Filled 2022-10-11: qty 6.7

## 2022-10-11 MED ORDER — AEROCHAMBER PLUS FLO-VU MEDIUM MISC
1.0000 | Freq: Once | Status: DC
  Filled 2022-10-11: qty 1

## 2022-10-11 NOTE — ED Provider Notes (Signed)
[facility] EMERGENCY DEPARTMENT Provider Note   CSN: [id number] Arrival date & time: 10/11/22  1512     History  Chief Complaint  Patient presents with   Fever    Cesar Gonzalez is a 71 y.o. male with history significant for COPD, CAP, tobacco use presents to the ED with complaint of fever and shortness of breath.  Patient tested COVID-positive on 10/02/2022 and took prescription of Paxlovid without relief.  He reports he has also had decreased appetite, but is still drinking fluids.  He does use a steroid inhaler at home, but does not have a rescue inhaler.  He reports he does still have a cough.  Denies chest pain, syncope, palpitations, vomiting, diarrhea, lightheadedness.        Home Medications Prior to Admission medications   Medication Sig Start Date End Date Taking? Authorizing Provider  albuterol (PROVENTIL HFA;VENTOLIN HFA) 108 (90 Base) MCG/ACT inhaler Inhale 2 puffs into the lungs every 4 (four) hours as needed for wheezing or shortness of breath. 09/18/18   Johnson, Clanford L, MD  ALPRAZolam Duanne Moron) 1 MG tablet Take 1 mg by mouth 3 (three) times daily as needed. 02/26/22   [provider]  Apple Cider Vinegar 500 MG TABS daily.    [provider]  aspirin EC 81 MG tablet Take 81 mg by mouth daily. Swallow whole.    [provider]  budesonide-formoterol (SYMBICORT) 80-4.5 MCG/ACT inhaler 2 puffs in the morning and at bedtime.    [provider]  colchicine 0.6 MG tablet Take 0.6 mg by mouth daily as needed (gout flare up).    [provider]  guaiFENesin (MUCINEX) 600 MG 12 hr tablet Take 1 tablet (600 mg total) by mouth 2 (two) times daily. 10/02/22   Volney American, PA-C  ipratropium-albuterol (DUONEB) 0.5-2.5 (3) MG/3ML SOLN Take 3 mLs by nebulization every 6 (six) hours as needed (wheezing, cough, shortness of breath). Patient not taking: Reported on 03/07/2022 09/18/18   Murlean Iba, MD  Multiple  Vitamins-Minerals (SUPER MULTI-VITAMIN PO) Take 1 Package by mouth daily.    [provider]  olmesartan (BENICAR) 40 MG tablet Take 40 mg by mouth daily.    [provider]  Omega-3 Fatty Acids (FISH OIL) 1000 MG CAPS Take 1,000 mg by mouth daily.    [provider]  predniSONE (DELTASONE) 20 MG tablet Take 2 tablets (40 mg total) by mouth daily with breakfast. 10/02/22   Volney American, PA-C  promethazine-dextromethorphan (PROMETHAZINE-DM) 6.25-15 MG/5ML syrup Take 5 mLs by mouth 4 (four) times daily as needed. 10/02/22   Volney American, PA-C  rosuvastatin (CRESTOR) 5 MG tablet Take 5 mg by mouth daily.    [provider]  Turmeric-Ginger-Black Pepper 125-6-50 MG-MG-MCG CHEW Chew 50 1e11 Vector Genomes by mouth once.    [provider]      Allergies    Patient has no known allergies.    Review of Systems   Review of Systems  Constitutional:  Positive for fever.  Respiratory:  Positive for cough and shortness of breath. Negative for chest tightness and wheezing.   Cardiovascular:  Negative for chest pain and palpitations.  Gastrointestinal:  Negative for abdominal pain, diarrhea, nausea and vomiting.  Neurological:  Negative for dizziness, syncope, weakness and light-headedness.    Physical Exam Updated Vital Signs BP 136/77 (BP Location: Right Arm)   Pulse 98   Temp 98.5 F (36.9 C) (Oral)   Resp 14   Ht  $'6\' 2"'E$  (1.88 m)   Wt 97.5 kg   SpO2 94%   BMI 27.60 kg/m  Physical Exam Vitals and nursing note reviewed.  Constitutional:      General: He is not in acute distress.    Appearance: He is not ill-appearing.  HENT:     Mouth/Throat:     Mouth: Mucous membranes are moist.     Pharynx: Oropharynx is clear.  Cardiovascular:     Rate and Rhythm: Normal rate and regular rhythm.     Pulses: Normal pulses.     Heart sounds: Normal heart sounds. No murmur heard. Pulmonary:     Effort: Pulmonary effort is normal. No  tachypnea or respiratory distress.     Breath sounds: Normal breath sounds and air entry. No decreased breath sounds, wheezing or rales.     Comments: No appreciable wheezes or rales on lung auscultation.  He appears to have appropriate tidal volume. Abdominal:     General: Abdomen is flat. Bowel sounds are normal. There is no distension.     Palpations: Abdomen is soft.     Tenderness: There is no abdominal tenderness.  Musculoskeletal:     Right lower leg: No edema.     Left lower leg: No edema.  Skin:    General: Skin is warm and dry.     Capillary Refill: Capillary refill takes less than 2 seconds.     Coloration: Skin is not cyanotic or pale.  Neurological:     Mental Status: He is alert. Mental status is at baseline.  Psychiatric:        Mood and Affect: Mood normal.        Behavior: Behavior normal.     ED Results / Procedures / Treatments   Labs (all labs ordered are listed, but only abnormal results are displayed) Labs Reviewed  RESP PANEL BY RT-PCR (RSV, FLU A&B, COVID)  RVPGX2 - Abnormal; Notable for the following components:      Result Value   SARS Coronavirus 2 by RT PCR POSITIVE (*)    All other components within normal limits  CBC WITH DIFFERENTIAL/PLATELET - Abnormal; Notable for the following components:   WBC 11.9 (*)    Neutro Abs 8.4 (*)    Abs Immature Granulocytes 0.09 (*)    All other components within normal limits  BASIC METABOLIC PANEL - Abnormal; Notable for the following components:   Chloride 96 (*)    BUN 29 (*)    Creatinine, Ser 1.25 (*)    All other components within normal limits    EKG None  Radiology DG Chest 2 View  Result Date: 10/11/2022 CLINICAL DATA:  Shortness of breath, tested positive for COVID-19 1 week ago, has not gotten better EXAM: CHEST - 2 VIEW COMPARISON:  09/15/2018 FINDINGS: Normal heart size, mediastinal contours, and pulmonary vascularity. Question RIGHT nipple shadow. Lungs otherwise clear. No pulmonary  infiltrate, pleural effusion, or pneumothorax. Osseous structures unremarkable. IMPRESSION: No acute infiltrate. Question RIGHT nipple shadow; repeat PA chest radiograph with nipple markers recommended to exclude pulmonary nodule. Electronically Signed   By: Lavonia Dana M.D.   On: 10/11/2022 17:23    Procedures Procedures    Medications Ordered in ED Medications  AeroChamber Plus Flo-Vu Medium MISC 1 each (1 each Other Not Given 10/11/22 2051)  albuterol (VENTOLIN HFA) 108 (90 Base) MCG/ACT inhaler 2 puff (2 puffs Inhalation Given 10/11/22 2050)    ED Course/ Medical Decision Making/ A&P  Medical Decision Making Amount and/or Complexity of Data Reviewed Labs: ordered. Radiology: ordered.  Risk Prescription drug management.   This patient presents to the ED with chief complaint(s) of shortness of breath, cough with pertinent past medical history of recent COVID diagnosis, COPD.The complaint involves an extensive differential diagnosis and also carries with it a high risk of complications and morbidity.    The differential diagnosis includes pneumonia, COPD exacerbation secondary to COVID, acute bronchitis  The initial plan is to obtain chest x-ray and baseline labs  Additional history obtained: Additional history obtained from  none Records reviewed  urgent care documents  Initial Assessment:   On exam, patient is resting comfortably in bed, does not appear to be in respiratory distress.  He is not tachypneic.  Lung sounds are clear to auscultation bilaterally, no appreciable wheezes or rales.  Heart rate is normal with regular rhythm.  Skin is warm and dry, nondiaphoretic.  Abdomen is soft and nontender to palpation.  He is able to speak in full sentences without difficulty.  Independent ECG/labs interpretation:  The following labs were independently interpreted:  COVID-positive, flu negative, RSV negative CBC significant for mild leukocytosis, no  anemia. Metabolic panel significant for borderline elevated creatinine, patient has had decreased fluid intake due to no appetite.  No major disturbance ECG demonstrates normal sinus rhythm without ectopy, ischemia, or infarction.  Independent visualization and interpretation of imaging: I independently visualized the following imaging with scope of interpretation limited to determining acute life threatening conditions related to emergency care: Chest x-ray, which revealed no infiltrate or consolidation to suggest pneumonia, no pneumothorax, no pleural effusion.  Suspect right side is nipple shadow.  I agree with radiologist interpretation.  Treatment and Reassessment: Will treat patient here in ED with albuterol inhaler that he can take home with him and use as needed.  Upon reassessment, patient reports his breathing feels much better and he would like to be discharged home.  Other treatment options considered:   N/A  Disposition:   The patient has been appropriately medically screened and/or stabilized in the ED. I have low suspicion for any other emergent medical condition which would require further screening, evaluation or treatment in the ED or require inpatient management. At time of discharge the patient is hemodynamically stable and in no acute distress. I have discussed work-up results and diagnosis with patient and answered all questions. Patient is agreeable with discharge plan. We discussed strict return precautions for returning to the emergency department and they verbalized understanding.  Patient was educated on how to use an albuterol inhaler and frequency of use.  Recommended patient follow-up with his primary care provider later this week to ensure his symptoms are improving.  Patient verbalizes understanding and is in agreement with plan.          Final Clinical Impression(s) / ED Diagnoses Final diagnoses:  COVID-19  Shortness of breath    Rx / DC Orders ED  Discharge Orders     None         Pat Kocher, [place] 10/11/22 2125    Isla Pence, MD 10/11/22 2350

## 2022-10-11 NOTE — Discharge Instructions (Addendum)
Thank you for allowing me to be part of your care today.  You are being sent home with an inhaler to use every 4-6 hours as needed for shortness of breath or chest tightness.  I recommend following up with your primary care provider later this week to ensure your symptoms are improving.  Please increase your hydration and stay well-hydrated during this time.  You may continue to take Tylenol as needed for fever or bodyaches.  Return to the ED if you experience any worsening of your breathing or have any new concerns.

## 2022-10-11 NOTE — ED Triage Notes (Addendum)
Pt presents to ED with complaints of fever up to 101. Pt states tested positive for covid on 12/17, took full rx of paxlovid without relief. Pt c/o shortness of breath, no appetite.

## 2022-10-18 DIAGNOSIS — U099 Post covid-19 condition, unspecified: Secondary | ICD-10-CM | POA: Diagnosis not present

## 2022-12-21 ENCOUNTER — Other Ambulatory Visit (HOSPITAL_COMMUNITY): Payer: Self-pay | Admitting: Family Medicine

## 2022-12-21 DIAGNOSIS — I739 Peripheral vascular disease, unspecified: Secondary | ICD-10-CM | POA: Diagnosis not present

## 2022-12-21 DIAGNOSIS — J449 Chronic obstructive pulmonary disease, unspecified: Secondary | ICD-10-CM | POA: Diagnosis not present

## 2022-12-21 DIAGNOSIS — U099 Post covid-19 condition, unspecified: Secondary | ICD-10-CM | POA: Diagnosis not present

## 2022-12-21 DIAGNOSIS — Z0000 Encounter for general adult medical examination without abnormal findings: Secondary | ICD-10-CM | POA: Diagnosis not present

## 2022-12-21 DIAGNOSIS — R5383 Other fatigue: Secondary | ICD-10-CM | POA: Diagnosis not present

## 2022-12-21 DIAGNOSIS — I10 Essential (primary) hypertension: Secondary | ICD-10-CM | POA: Diagnosis not present

## 2022-12-21 DIAGNOSIS — E782 Mixed hyperlipidemia: Secondary | ICD-10-CM | POA: Diagnosis not present

## 2022-12-21 DIAGNOSIS — R7309 Other abnormal glucose: Secondary | ICD-10-CM | POA: Diagnosis not present

## 2022-12-29 ENCOUNTER — Ambulatory Visit (HOSPITAL_COMMUNITY)
Admission: RE | Admit: 2022-12-29 | Discharge: 2022-12-29 | Disposition: A | Discharge status: Home / Self Care | Payer: Medicare Other | Source: Ambulatory Visit | Attending: Family Medicine | Admitting: Family Medicine

## 2022-12-29 DIAGNOSIS — I739 Peripheral vascular disease, unspecified: Secondary | ICD-10-CM

## 2023-01-04 ENCOUNTER — Other Ambulatory Visit: Payer: Self-pay

## 2023-01-04 DIAGNOSIS — I739 Peripheral vascular disease, unspecified: Secondary | ICD-10-CM

## 2023-01-10 ENCOUNTER — Encounter: Payer: Self-pay | Admitting: Vascular Surgery

## 2023-01-10 DIAGNOSIS — U099 Post covid-19 condition, unspecified: Secondary | ICD-10-CM | POA: Insufficient documentation

## 2023-01-10 DIAGNOSIS — F419 Anxiety disorder, unspecified: Secondary | ICD-10-CM | POA: Insufficient documentation

## 2023-01-10 DIAGNOSIS — R5383 Other fatigue: Secondary | ICD-10-CM | POA: Insufficient documentation

## 2023-01-10 DIAGNOSIS — R972 Elevated prostate specific antigen [PSA]: Secondary | ICD-10-CM | POA: Insufficient documentation

## 2023-01-10 DIAGNOSIS — E785 Hyperlipidemia, unspecified: Secondary | ICD-10-CM | POA: Insufficient documentation

## 2023-01-10 DIAGNOSIS — I739 Peripheral vascular disease, unspecified: Secondary | ICD-10-CM | POA: Insufficient documentation

## 2023-01-11 ENCOUNTER — Ambulatory Visit: Payer: Medicare Other | Admitting: Vascular Surgery

## 2023-01-11 ENCOUNTER — Encounter: Payer: Self-pay | Admitting: Vascular Surgery

## 2023-01-11 ENCOUNTER — Ambulatory Visit: Payer: Medicare Other

## 2023-01-11 VITALS — BP 132/77 | HR 94 | Temp 98.4°F | Ht 74.0 in | Wt 221.0 lb

## 2023-01-11 DIAGNOSIS — I739 Peripheral vascular disease, unspecified: Secondary | ICD-10-CM

## 2023-01-11 DIAGNOSIS — E7849 Other hyperlipidemia: Secondary | ICD-10-CM | POA: Diagnosis not present

## 2023-01-11 DIAGNOSIS — U099 Post covid-19 condition, unspecified: Secondary | ICD-10-CM | POA: Diagnosis not present

## 2023-01-11 DIAGNOSIS — R972 Elevated prostate specific antigen [PSA]: Secondary | ICD-10-CM | POA: Diagnosis not present

## 2023-01-11 DIAGNOSIS — F419 Anxiety disorder, unspecified: Secondary | ICD-10-CM

## 2023-01-11 DIAGNOSIS — R5383 Other fatigue: Secondary | ICD-10-CM

## 2023-01-11 NOTE — Progress Notes (Signed)
[facility] [place]  Patient name: Cesar Gonzalez MRN: [id number] DOB: 1951-03-27 Sex: male  REASON FOR CONSULT: Evaluation lower extremity arterial insufficiency  HPI: Cesar Gonzalez is a 72 y.o. male, who is here today for discussion of lower extremity arterial insufficiency.  He has complaints of occasional numbness and burning and toe pain in his feet bilaterally.  This is more so on his right than his left.  He has no tissue loss.  He specifically denies any calf claudication symptoms.  He has no history of cardiac disease.  Fortunately he quit smoking in 2022.  Past Medical History:  Diagnosis Date   Anxiety    COPD (chronic obstructive pulmonary disease) ([place]) 09/17/2018   Essential hypertension    Gout    Mixed hyperlipidemia    Nephritis    Pneumonia     Family History  Problem Relation Age of Onset   Diabetes Mother    Osteoporosis Mother    COPD Father    Prostate cancer Father    Stroke Brother    Heart failure Brother    Heart attack Maternal Grandfather    Brain cancer Paternal Grandmother    Stroke Paternal Grandfather     SOCIAL HISTORY: Social History   Socioeconomic History   Marital status: Married    Spouse name: Not on file   Number of children: Not on file   Years of education: Not on file   Highest education level: Not on file  Occupational History   Not on file  Tobacco Use   Smoking status: Former    Packs/day: 1.00    Years: 20.00    Additional pack years: 0.00    Total pack years: 20.00    Types: Cigarettes    Quit date: 2022    Years since quitting: 2.2   Smokeless tobacco: Never  Vaping Use   Vaping Use: Never used  Substance and Sexual Activity   Alcohol use: Not Currently    Comment: Occasional   Drug use: No   Sexual activity: Yes    Birth control/protection: None  Other Topics Concern   Not on file  Social History Narrative   Not on file   Social  Determinants of Health   Financial Resource Strain: Not on file  Food Insecurity: Not on file  Transportation Needs: Not on file  Physical Activity: Not on file  Stress: Not on file  Social Connections: Not on file  Intimate Partner Violence: Not on file    No Known Allergies  Current Outpatient Medications  Medication Sig Dispense Refill   calcipotriene (DOVONOX) 0.005 % ointment SMARTSIG:Sparingly Topical Twice Daily     predniSONE (DELTASONE) 10 MG tablet Take 10 mg by mouth daily as needed.     albuterol (PROVENTIL HFA;VENTOLIN HFA) 108 (90 Base) MCG/ACT inhaler Inhale 2 puffs into the lungs every 4 (four) hours as needed for wheezing or shortness of breath. 1 Inhaler 0   ALPRAZolam (XANAX) 1 MG tablet Take 1 mg by mouth 3 (three) times daily as needed.     Apple Cider Vinegar 500 MG TABS daily.     aspirin EC 81 MG tablet Take 81 mg by mouth daily. Swallow whole.     budesonide-formoterol (SYMBICORT) 80-4.5 MCG/ACT inhaler 2 puffs in the morning and at bedtime.     colchicine 0.6 MG tablet Take 0.6 mg by mouth daily as needed (gout flare up).     Multiple Vitamins-Minerals (SUPER MULTI-VITAMIN PO)  Take 1 Package by mouth daily.     olmesartan (BENICAR) 40 MG tablet Take 40 mg by mouth daily.     Omega-3 Fatty Acids (FISH OIL) 1000 MG CAPS Take 1,000 mg by mouth daily.     rosuvastatin (CRESTOR) 5 MG tablet Take 5 mg by mouth daily.     Turmeric-Ginger-Black Pepper 125-6-50 MG-MG-MCG CHEW Chew 50 1e11 Vector Genomes by mouth once.     No current facility-administered medications for this visit.    REVIEW OF SYSTEMS:  [X]  denotes positive finding, [ ]  denotes negative finding Cardiac  Comments:  Chest pain or chest pressure:    Shortness of breath upon exertion:    Short of breath when lying flat:    Irregular heart rhythm:        Vascular    Pain in calf, thigh, or hip brought on by ambulation:    Pain in feet at night that wakes you up from your sleep:     Blood clot  in your veins:    Leg swelling:         Pulmonary    Oxygen at home:    Productive cough:     Wheezing:         Neurologic    Sudden weakness in arms or legs:     Sudden numbness in arms or legs:     Sudden onset of difficulty speaking or slurred speech:    Temporary loss of vision in one eye:     Problems with dizziness:         Gastrointestinal    Blood in stool:     Vomited blood:         Genitourinary    Burning when urinating:     Blood in urine:        Psychiatric    Major depression:         Hematologic    Bleeding problems:    Problems with blood clotting too easily:        Skin    Rashes or ulcers: x       Constitutional    Fever or chills:      PHYSICAL EXAM: Vitals:   01/11/23 1242  BP: 132/77  Pulse: 94  Temp: 98.4 F (36.9 C)  SpO2: 94%  Weight: 221 lb (100.2 kg)  Height: 6\' 2"  (1.88 m)    GENERAL: The patient is a well-nourished male, in no acute distress. The vital signs are documented above. CARDIOVASCULAR: 2 Plus radial pulses bilaterally.  Palpable femoral pulses do not palpate pedal pulses on the right.  He does have 2+ dorsalis pedis and posterior tibial pulse on the left bilaterally. PULMONARY: There is good air exchange  MUSCULOSKELETAL: There are no major deformities or cyanosis. NEUROLOGIC: No focal weakness or paresthesias are detected. SKIN: There are no ulcers or rashes noted. PSYCHIATRIC: The patient has a normal affect.  DATA:  Noninvasive arterial studies from [facility] on 12/29/2022 reviewed with the patient.  This reveals a 3.64 ankle arm index on the right with monophasic flow and 0.80 on the left with monophasic to biphasic flow.  MEDICAL ISSUES: I long discussion with the patient regarding this.  Explained that he is at no risk for limb loss related to his arterial insufficiency.  He is not experiencing any calf claudication on the right.  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  He will seek attention  should he develop any nonhealing wounds which I think would be extremely unlikely.  He will otherwise see [place] again on an as-needed basis.  I congratulated him on his smoking cessation and explained that this makes it extremely unlikely that he would have progression of his arterial disease.   Rosetta Posner, MD FACS [facility] [phone] Pager [phone]  Note: Portions of this report may have been transcribed using voice recognition software.  Every effort has been made to ensure accuracy; however, inadvertent computerized transcription errors may still be present.

## 2023-01-25 DIAGNOSIS — Z86010 Personal history of colonic polyps: Secondary | ICD-10-CM | POA: Diagnosis not present

## 2023-01-25 DIAGNOSIS — D123 Benign neoplasm of transverse colon: Secondary | ICD-10-CM | POA: Diagnosis not present

## 2023-01-25 DIAGNOSIS — Z09 Encounter for follow-up examination after completed treatment for conditions other than malignant neoplasm: Secondary | ICD-10-CM | POA: Diagnosis not present

## 2023-01-25 DIAGNOSIS — D122 Benign neoplasm of ascending colon: Secondary | ICD-10-CM | POA: Diagnosis not present

## 2023-01-25 DIAGNOSIS — K648 Other hemorrhoids: Secondary | ICD-10-CM | POA: Diagnosis not present

## 2023-01-25 DIAGNOSIS — D125 Benign neoplasm of sigmoid colon: Secondary | ICD-10-CM | POA: Diagnosis not present

## 2023-01-25 DIAGNOSIS — D124 Benign neoplasm of descending colon: Secondary | ICD-10-CM | POA: Diagnosis not present

## 2023-01-27 DIAGNOSIS — D125 Benign neoplasm of sigmoid colon: Secondary | ICD-10-CM | POA: Diagnosis not present

## 2023-01-27 DIAGNOSIS — H35033 Hypertensive retinopathy, bilateral: Secondary | ICD-10-CM | POA: Diagnosis not present

## 2023-01-27 DIAGNOSIS — H26491 Other secondary cataract, right eye: Secondary | ICD-10-CM | POA: Diagnosis not present

## 2023-01-27 DIAGNOSIS — H524 Presbyopia: Secondary | ICD-10-CM | POA: Diagnosis not present

## 2023-01-27 DIAGNOSIS — D124 Benign neoplasm of descending colon: Secondary | ICD-10-CM | POA: Diagnosis not present

## 2023-01-27 DIAGNOSIS — H40013 Open angle with borderline findings, low risk, bilateral: Secondary | ICD-10-CM | POA: Diagnosis not present

## 2023-01-27 DIAGNOSIS — D122 Benign neoplasm of ascending colon: Secondary | ICD-10-CM | POA: Diagnosis not present

## 2023-01-27 DIAGNOSIS — H35362 Drusen (degenerative) of macula, left eye: Secondary | ICD-10-CM | POA: Diagnosis not present

## 2023-01-27 DIAGNOSIS — D123 Benign neoplasm of transverse colon: Secondary | ICD-10-CM | POA: Diagnosis not present

## 2023-01-31 DIAGNOSIS — J40 Bronchitis, not specified as acute or chronic: Secondary | ICD-10-CM | POA: Diagnosis not present

## 2023-01-31 DIAGNOSIS — R6889 Other general symptoms and signs: Secondary | ICD-10-CM | POA: Diagnosis not present

## 2023-01-31 DIAGNOSIS — J449 Chronic obstructive pulmonary disease, unspecified: Secondary | ICD-10-CM | POA: Diagnosis not present

## 2023-02-09 ENCOUNTER — Ambulatory Visit (INDEPENDENT_AMBULATORY_CARE_PROVIDER_SITE_OTHER): Payer: Medicare Other

## 2023-02-09 ENCOUNTER — Emergency Department (HOSPITAL_COMMUNITY)
Admission: EM | Admit: 2023-02-09 | Discharge: 2023-02-09 | Disposition: A | Discharge status: Home / Self Care | Payer: Medicare Other | Attending: Emergency Medicine | Admitting: Emergency Medicine

## 2023-02-09 ENCOUNTER — Other Ambulatory Visit: Payer: Self-pay

## 2023-02-09 ENCOUNTER — Encounter: Payer: Self-pay | Admitting: Emergency Medicine

## 2023-02-09 ENCOUNTER — Ambulatory Visit
Admission: EM | Admit: 2023-02-09 | Discharge: 2023-02-09 | Disposition: A | Discharge status: Home / Self Care | Payer: Medicare Other | Attending: Family Medicine | Admitting: Family Medicine

## 2023-02-09 DIAGNOSIS — R0602 Shortness of breath: Secondary | ICD-10-CM | POA: Diagnosis not present

## 2023-02-09 DIAGNOSIS — J40 Bronchitis, not specified as acute or chronic: Secondary | ICD-10-CM | POA: Diagnosis not present

## 2023-02-09 DIAGNOSIS — D72829 Elevated white blood cell count, unspecified: Secondary | ICD-10-CM | POA: Diagnosis not present

## 2023-02-09 DIAGNOSIS — J209 Acute bronchitis, unspecified: Secondary | ICD-10-CM

## 2023-02-09 DIAGNOSIS — Z7952 Long term (current) use of systemic steroids: Secondary | ICD-10-CM | POA: Insufficient documentation

## 2023-02-09 DIAGNOSIS — Z7951 Long term (current) use of inhaled steroids: Secondary | ICD-10-CM | POA: Diagnosis not present

## 2023-02-09 DIAGNOSIS — R0902 Hypoxemia: Secondary | ICD-10-CM | POA: Diagnosis not present

## 2023-02-09 DIAGNOSIS — Z7982 Long term (current) use of aspirin: Secondary | ICD-10-CM | POA: Diagnosis not present

## 2023-02-09 LAB — CBC WITH DIFFERENTIAL/PLATELET
Abs Immature Granulocytes: 0.17 10*3/uL — ABNORMAL HIGH (ref 0.00–0.07)
Basophils Absolute: 0 10*3/uL (ref 0.0–0.1)
Basophils Relative: 0 %
Eosinophils Absolute: 0.2 10*3/uL (ref 0.0–0.5)
Eosinophils Relative: 1 %
HCT: 49.1 % (ref 39.0–52.0)
Hemoglobin: 16.4 g/dL (ref 13.0–17.0)
Immature Granulocytes: 1 %
Lymphocytes Relative: 22 %
Lymphs Abs: 3.4 10*3/uL (ref 0.7–4.0)
MCH: 31.1 pg (ref 26.0–34.0)
MCHC: 33.4 g/dL (ref 30.0–36.0)
MCV: 93 fL (ref 80.0–100.0)
Monocytes Absolute: 1 10*3/uL (ref 0.1–1.0)
Monocytes Relative: 6 %
Neutro Abs: 10.6 10*3/uL — ABNORMAL HIGH (ref 1.7–7.7)
Neutrophils Relative %: 70 %
Platelets: 328 10*3/uL (ref 150–400)
RBC: 5.28 MIL/uL (ref 4.22–5.81)
RDW: 12 % (ref 11.5–15.5)
WBC: 15.3 10*3/uL — ABNORMAL HIGH (ref 4.0–10.5)
nRBC: 0 % (ref 0.0–0.2)

## 2023-02-09 LAB — BASIC METABOLIC PANEL
Anion gap: 11 (ref 5–15)
BUN: 30 mg/dL — ABNORMAL HIGH (ref 8–23)
CO2: 27 mmol/L (ref 22–32)
Calcium: 8.8 mg/dL — ABNORMAL LOW (ref 8.9–10.3)
Chloride: 100 mmol/L (ref 98–111)
Creatinine, Ser: 1.15 mg/dL (ref 0.61–1.24)
GFR, Estimated: >60 mL/min (ref >60)
Glucose, Bld: 111 mg/dL — ABNORMAL HIGH (ref 70–99)
Potassium: 3.4 mmol/L — ABNORMAL LOW (ref 3.5–5.1)
Sodium: 138 mmol/L (ref 135–145)

## 2023-02-09 LAB — TROPONIN I (HIGH SENSITIVITY)
Troponin I (High Sensitivity): 4 ng/L (ref <18)
Troponin I (High Sensitivity): 4 ng/L (ref <18)

## 2023-02-09 LAB — D-DIMER, QUANTITATIVE: D-Dimer, Quant: 0.28 ug/mL-FEU (ref 0.00–0.50)

## 2023-02-09 LAB — BRAIN NATRIURETIC PEPTIDE: B Natriuretic Peptide: 95 pg/mL (ref 0.0–100.0)

## 2023-02-09 MED ORDER — IPRATROPIUM-ALBUTEROL 0.5-2.5 (3) MG/3ML IN SOLN
3.0000 mL | Freq: Once | RESPIRATORY_TRACT | Status: AC
  Administered 2023-02-09: 3 mL via RESPIRATORY_TRACT

## 2023-02-09 MED ORDER — IPRATROPIUM-ALBUTEROL 0.5-2.5 (3) MG/3ML IN SOLN
3.0000 mL | Freq: Once | RESPIRATORY_TRACT | Status: AC
  Administered 2023-02-09: 3 mL via RESPIRATORY_TRACT
  Filled 2023-02-09: qty 3

## 2023-02-09 NOTE — ED Notes (Signed)
ED Provider at bedside. 

## 2023-02-09 NOTE — Discharge Instructions (Signed)
The test today in the ED did not show any signs of pneumonia, blood clots, heart failure or acute heart issues.  Continue the medications that you were previously given.  Follow-up with your primary doctor to be rechecked

## 2023-02-09 NOTE — ED Notes (Signed)
Patient is being discharged from the Urgent Care and sent to the Emergency Department via POV . Per Mardella Layman M.D, patient is in need of higher level of care due to low o2 levels. Patient is aware and verbalizes understanding of plan of care.  Vitals:   02/09/23 1100 02/09/23 1219  BP: (!) 143/84   Pulse: 94   Resp: 20   Temp: 97.7 F (36.5 C)   SpO2: (!) 89% (!) 86%

## 2023-02-09 NOTE — ED Triage Notes (Signed)
Pt was sent from urgent care for low O2 87-90%. Pt was seen at pcp 9 days ago and put on antibiotic and steriods for cough and congestion. Went to urgent care today due to not feeling better. Pt O2 98% in Triage. In NAD.

## 2023-02-09 NOTE — ED Notes (Signed)
O2 sat lowest was 92% on ambulation in halls    NO SOB noted

## 2023-02-09 NOTE — ED Notes (Signed)
Awaiting for provider to get POC

## 2023-02-09 NOTE — ED Provider Notes (Signed)
Follow up on labs, CXR negative here.  Not hypoxic here.  Possible dc if negative Clinical Course as of 02/09/23 1707  Thu Feb 09, 2023  1658 Labs reviewed.  Leukocytosis noted.  D-dimer normal.  BNP normal.  Metabolic panel without significant abnormalities [JK]    Clinical Course User Index [JK] Linwood Dibbles, MD   Patient's ED workup is reassuring.  His oxygen level has remained normal.  He is not requiring any supplemental oxygen.  His serial troponins are normal.  No signs to suggest PE DVT.  Patient is feeling well.  He has been able to walk without difficulty.  Will have him continue his medications including steroids.  Outpatient follow-up with PCP.   Linwood Dibbles, MD 02/09/23 [phone]

## 2023-02-09 NOTE — Discharge Instructions (Signed)
Your oxygen saturation is hovering between 86-88% here. Because of this I am advising that you go to the Emergency Department for further evaluation.   Your chest x-ray here did not show any acute abnormalities.

## 2023-02-09 NOTE — ED Provider Notes (Signed)
[place] EMERGENCY DEPARTMENT AT [facility] Provider Note   CSN: [id number] Arrival date & time: 02/09/23  1254     History  Chief Complaint  Patient presents with   Low O2   Cough   Nasal Congestion    Cesar Gonzalez is a 72 y.o. male.  HPI     72 year old male comes in with chief complaint of cough, nasal congestion and low O2 sats.  Patient states that he developed URI-like symptoms, cough few days back and saw his PCP.  He was started on prednisone and antibiotics.  He is almost completed the antibiotics, but continues to have generalized malaise and the cough has been persistent, therefore he went to the urgent care today.  At the urgent care they noted that his O2 sats were extremely low and he was asked to come to the emergency room.  Pt has no hx of PE, DVT and denies any exogenous hormone (testosterone / estrogen) use, long distance travels or surgery in the past 6 weeks, active cancer, recent immobilization.   Patient denies any chest pain.  He does indicate that he is noticing shortness of breath with exertion which is new for him.  He had a stress test last year which was reassuring.  Home Medications Prior to Admission medications   Medication Sig Start Date End Date Taking? Authorizing Provider  albuterol (PROVENTIL HFA;VENTOLIN HFA) 108 (90 Base) MCG/ACT inhaler Inhale 2 puffs into the lungs every 4 (four) hours as needed for wheezing or shortness of breath. 09/18/18   Johnson, Clanford L, MD  ALPRAZolam Prudy Feeler) 1 MG tablet Take 1 mg by mouth 3 (three) times daily as needed. 02/26/22   [provider]  Apple Cider Vinegar 500 MG TABS daily.    [provider]  aspirin EC 81 MG tablet Take 81 mg by mouth daily. Swallow whole.    [provider]  budesonide-formoterol (SYMBICORT) 80-4.5 MCG/ACT inhaler 2 puffs in the morning and at bedtime.    [provider]  colchicine 0.6 MG tablet Take 0.6 mg by mouth daily as  needed (gout flare up).    [provider]  Multiple Vitamins-Minerals (SUPER MULTI-VITAMIN PO) Take 1 Package by mouth daily.    [provider]  olmesartan (BENICAR) 40 MG tablet Take 40 mg by mouth daily.    [provider]  Omega-3 Fatty Acids (FISH OIL) 1000 MG CAPS Take 1,000 mg by mouth daily.    [provider]  predniSONE (DELTASONE) 10 MG tablet Take 10 mg by mouth daily as needed. 10/18/22   [provider]  rosuvastatin (CRESTOR) 5 MG tablet Take 5 mg by mouth daily.    [provider]      Allergies    Patient has no known allergies.    Review of Systems   Review of Systems  All other systems reviewed and are negative.   Physical Exam Updated Vital Signs BP 132/78   Pulse 72   Temp 98.1 F (36.7 C)   Resp 18   Wt 100.2 kg   SpO2 97%   BMI 28.36 kg/m  Physical Exam Vitals and nursing note reviewed.  Constitutional:      Appearance: He is well-developed.  HENT:     Head: Atraumatic.  Cardiovascular:     Rate and Rhythm: Normal rate.     Heart sounds: No murmur heard. Pulmonary:     Effort: Pulmonary effort is normal.     Breath sounds:  Wheezing present.     Comments: Faint wheezing appreciated over the lower lung fields bilaterally Musculoskeletal:     Cervical back: Neck supple.     Right lower leg: No edema.     Left lower leg: No edema.  Skin:    General: Skin is warm.  Neurological:     Mental Status: He is alert and oriented to person, place, and time.     ED Results / Procedures / Treatments   Labs (all labs ordered are listed, but only abnormal results are displayed) Labs Reviewed  BASIC METABOLIC PANEL - Abnormal; Notable for the following components:      Result Value   Potassium 3.4 (*)    Glucose, Bld 111 (*)    BUN 30 (*)    Calcium 8.8 (*)    All other components within normal limits  CBC WITH DIFFERENTIAL/PLATELET - Abnormal; Notable for the following components:   WBC 15.3 (*)     Neutro Abs 10.6 (*)    Abs Immature Granulocytes 0.17 (*)    All other components within normal limits  BRAIN NATRIURETIC PEPTIDE  D-DIMER, QUANTITATIVE  TROPONIN I (HIGH SENSITIVITY)  TROPONIN I (HIGH SENSITIVITY)    EKG None completed at the urgent care   Date: 02/10/2023  Rate: 83  Rhythm: normal sinus rhythm  QRS Axis: normal  Intervals: normal  ST/T Wave abnormalities: normal  Conduction Disutrbances: none  Narrative Interpretation: unremarkable   Radiology DG Chest 2 View  Result Date: 02/09/2023 CLINICAL DATA:  Shortness of breath. EXAM: CHEST - 2 VIEW COMPARISON:  10/11/2022 FINDINGS: The cardiac silhouette, mediastinal and hilar contours are. The lungs are clear. No infiltrates or effusions. No pulmonary lesions. Bilateral nipple shadows again noted. The bony thorax is unremarkable. IMPRESSION: No acute cardiopulmonary findings. Electronically Signed   By: Rudie Meyer M.D.   On: 02/09/2023 11:45    Procedures Procedures    Medications Ordered in ED Medications  ipratropium-albuterol (DUONEB) 0.5-2.5 (3) MG/3ML nebulizer solution 3 mL (3 mLs Nebulization Given 02/09/23 1431)    ED Course/ Medical Decision Making/ A&P Clinical Course as of 02/10/23 0835  Thu Feb 09, 2023  1658 Labs reviewed.  Leukocytosis noted.  D-dimer normal.  BNP normal.  Metabolic panel without significant abnormalities [JK]    Clinical Course User Index [JK] Linwood Dibbles, MD                             Medical Decision Making Amount and/or Complexity of Data Reviewed Labs: ordered.  Risk Prescription drug management.   72 year old male brought into the emergency room with chief complaint of low oxygen saturation.  It appears that he has been having some URI-like symptoms over the last few days, exertional shortness of breath and malaise.  He had gone to the urgent care and was found to have low O2 sats.  He is already taking antibiotics for presumed walking pneumonia and he is also  on prednisone.  Patient's O2 sats are normal in the emergency room.  I have independently interpreted the EKG patient had at the urgent care and also the chest x-ray at the urgent care.  Chest x-ray does not show any evidence of pneumonia or pleural effusion.  EKG is also reassuring, no signs of ischemia.  No right-sided heart strain.  Differential diagnosis considered for this patient at this time includes PE, angina pectoris/ACS, valvular disorder, CHF.  I have reviewed patient's previous records including  stress test that he had done last year which was negative for any concerning findings.  Patient has no evidence of pitting edema, no murmur on exam.  Basic labs ordered to rule out anemia also we will order D-dimer.  If patient's workup in the emergency room is reassuring and then he can be discharged with PCP follow-up.  Final Clinical Impression(s) / ED Diagnoses Final diagnoses:  Bronchitis    Rx / DC Orders ED Discharge Orders     None         Derwood Kaplan, MD 02/10/23 [phone]

## 2023-02-09 NOTE — ED Provider Notes (Signed)
[facility]   [id number] 02/09/23 Arrival Time: 1049  ASSESSMENT & PLAN:  1. SOB (shortness of breath) on exertion   2. Hypoxia    SpO2 hovering around 88% at rest on RA. With exertion, around 86%. Does not use home O2. Has been on Augmentin and prednisone.  I have personally viewed and independently interpreted the imaging studies obtained this visit. CXR: no acute changes noted; no signs of PNA.  ECG: Performed today and interpreted by me: normal EKG, normal sinus rhythm. No STEMI or signs of ischemia.  Meds ordered this encounter  Medications   ipratropium-albuterol (DUONEB) 0.5-2.5 (3) MG/3ML nebulizer solution 3 mL  No change in SpO2 after DuoNeb.  Discharged to ED via POV; stable upon discharge.  Reviewed expectations re: course of current medical issues. Questions answered. Outlined signs and symptoms indicating need for more acute intervention. Patient verbalized understanding. After Visit Summary given.   SUBJECTIVE:  History from: patient. Cesar Gonzalez is a 72 y.o. male with COPD who  was seen by PCP 1 week ago; Rx Augmentin and prednisone. Has been taking but continues with productive cough and SOB, esp with exertion. Denies CP/n/v/diaphoresis. Initial fever but none over past several days. A little lightheaded at times over past few days also. Denies: irregular heart beat, lower extremity edema, near-syncope, orthopnea, palpitations, paroxysmal nocturnal dyspnea, and syncope. Denies recreational drug use.  Social History   Tobacco Use  Smoking Status Former   Packs/day: 1.00   Years: 20.00   Additional pack years: 0.00   Total pack years: 20.00   Types: Cigarettes   Quit date: 2022   Years since quitting: 2.3  Smokeless Tobacco Never   Social History   Substance and Sexual Activity  Alcohol Use Not Currently   Comment: Occasional   OBJECTIVE:  Vitals:   02/09/23 1100 02/09/23 1219  BP: (!) 143/84   Pulse: 94   Resp: 20    Temp: 97.7 F (36.5 C)   TempSrc: Temporal   SpO2: (!) 89% (!) 86%    General appearance: alert, oriented, no acute distress Eyes: PERRLA; EOMI; conjunctivae normal HENT: normocephalic; atraumatic Neck: supple with FROM Lungs: without labored respirations; speaks full sentences without difficulty; CTAB but with a questionable rub on the right Heart: regular Abdomen: soft Extremities: without edema; without calf swelling or tenderness; symmetrical without gross deformities Skin: warm and dry; without rash or lesions Neuro: normal gait Psychological: alert and cooperative; normal mood and affect  Imaging: DG Chest 2 View  Result Date: 02/09/2023 CLINICAL DATA:  Shortness of breath. EXAM: CHEST - 2 VIEW COMPARISON:  10/11/2022 FINDINGS: The cardiac silhouette, mediastinal and hilar contours are. The lungs are clear. No infiltrates or effusions. No pulmonary lesions. Bilateral nipple shadows again noted. The bony thorax is unremarkable. IMPRESSION: No acute cardiopulmonary findings. Electronically Signed   By: Rudie Meyer M.D.   On: 02/09/2023 11:45     No Known Allergies  Past Medical History:  Diagnosis Date   Anxiety    COPD (chronic obstructive pulmonary disease) 09/17/2018   Essential hypertension    Gout    Mixed hyperlipidemia    Nephritis    Pneumonia    Social History   Socioeconomic History   Marital status: Married    Spouse name: Not on file   Number of children: Not on file   Years of education: Not on file   Highest education level: Not on file  Occupational History   Not on file  Tobacco  Use   Smoking status: Former    Packs/day: 1.00    Years: 20.00    Additional pack years: 0.00    Total pack years: 20.00    Types: Cigarettes    Quit date: 2022    Years since quitting: 2.3   Smokeless tobacco: Never  Vaping Use   Vaping Use: Never used  Substance and Sexual Activity   Alcohol use: Not Currently    Comment: Occasional   Drug use: No   Sexual  activity: Yes    Birth control/protection: None  Other Topics Concern   Not on file  Social History Narrative   Not on file   Social Determinants of Health   Financial Resource Strain: Not on file  Food Insecurity: Not on file  Transportation Needs: Not on file  Physical Activity: Not on file  Stress: Not on file  Social Connections: Not on file  Intimate Partner Violence: Not on file   Family History  Problem Relation Age of Onset   Diabetes Mother    Osteoporosis Mother    COPD Father    Prostate cancer Father    Stroke Brother    Heart failure Brother    Heart attack Maternal Grandfather    Brain cancer Paternal Grandmother    Stroke Paternal Grandfather    Past Surgical History:  Procedure Laterality Date   TONSILLECTOMY        Mardella Layman, MD 02/09/23 1233

## 2023-02-09 NOTE — ED Triage Notes (Addendum)
Pt reports was seen at pcp x1 weeks ago for same and reports tested negative for covid, prescribed abx (has 1 dose remaining), prednisone, and tessalon perles (not helping). Reports continued productive cough, dyspnea with exertion. Denies pain or fever at this time.

## 2023-04-11 ENCOUNTER — Observation Stay (HOSPITAL_COMMUNITY)
Admission: EM | Admit: 2023-04-11 | Discharge: 2023-04-12 | Disposition: A | Discharge status: Home / Self Care | Payer: Medicare Other | Attending: Internal Medicine | Admitting: Internal Medicine

## 2023-04-11 ENCOUNTER — Emergency Department (HOSPITAL_COMMUNITY): Payer: Medicare Other

## 2023-04-11 ENCOUNTER — Other Ambulatory Visit: Payer: Self-pay

## 2023-04-11 ENCOUNTER — Encounter (HOSPITAL_COMMUNITY): Payer: Self-pay | Admitting: *Deleted

## 2023-04-11 DIAGNOSIS — Z79899 Other long term (current) drug therapy: Secondary | ICD-10-CM | POA: Diagnosis not present

## 2023-04-11 DIAGNOSIS — J90 Pleural effusion, not elsewhere classified: Secondary | ICD-10-CM | POA: Diagnosis not present

## 2023-04-11 DIAGNOSIS — F419 Anxiety disorder, unspecified: Secondary | ICD-10-CM | POA: Diagnosis present

## 2023-04-11 DIAGNOSIS — E785 Hyperlipidemia, unspecified: Secondary | ICD-10-CM | POA: Diagnosis present

## 2023-04-11 DIAGNOSIS — I10 Essential (primary) hypertension: Secondary | ICD-10-CM | POA: Insufficient documentation

## 2023-04-11 DIAGNOSIS — Z1152 Encounter for screening for COVID-19: Secondary | ICD-10-CM | POA: Diagnosis not present

## 2023-04-11 DIAGNOSIS — N179 Acute kidney failure, unspecified: Secondary | ICD-10-CM | POA: Diagnosis not present

## 2023-04-11 DIAGNOSIS — J189 Pneumonia, unspecified organism: Principal | ICD-10-CM | POA: Diagnosis present

## 2023-04-11 DIAGNOSIS — J449 Chronic obstructive pulmonary disease, unspecified: Secondary | ICD-10-CM | POA: Insufficient documentation

## 2023-04-11 DIAGNOSIS — R509 Fever, unspecified: Secondary | ICD-10-CM | POA: Diagnosis not present

## 2023-04-11 DIAGNOSIS — J9601 Acute respiratory failure with hypoxia: Secondary | ICD-10-CM | POA: Diagnosis present

## 2023-04-11 DIAGNOSIS — Z87891 Personal history of nicotine dependence: Secondary | ICD-10-CM | POA: Diagnosis not present

## 2023-04-11 DIAGNOSIS — R000 Tachycardia, unspecified: Secondary | ICD-10-CM | POA: Diagnosis not present

## 2023-04-11 DIAGNOSIS — R918 Other nonspecific abnormal finding of lung field: Secondary | ICD-10-CM | POA: Diagnosis not present

## 2023-04-11 DIAGNOSIS — I2510 Atherosclerotic heart disease of native coronary artery without angina pectoris: Secondary | ICD-10-CM | POA: Diagnosis not present

## 2023-04-11 DIAGNOSIS — A419 Sepsis, unspecified organism: Secondary | ICD-10-CM | POA: Diagnosis present

## 2023-04-11 DIAGNOSIS — R0602 Shortness of breath: Secondary | ICD-10-CM | POA: Diagnosis not present

## 2023-04-11 DIAGNOSIS — R6520 Severe sepsis without septic shock: Secondary | ICD-10-CM

## 2023-04-11 DIAGNOSIS — Z7982 Long term (current) use of aspirin: Secondary | ICD-10-CM | POA: Insufficient documentation

## 2023-04-11 LAB — COMPREHENSIVE METABOLIC PANEL
ALT: 20 U/L (ref 0–44)
AST: 24 U/L (ref 15–41)
Albumin: 3.8 g/dL (ref 3.5–5.0)
Alkaline Phosphatase: 57 U/L (ref 38–126)
Anion gap: 10 (ref 5–15)
BUN: 26 mg/dL — ABNORMAL HIGH (ref 8–23)
CO2: 27 mmol/L (ref 22–32)
Calcium: 8.7 mg/dL — ABNORMAL LOW (ref 8.9–10.3)
Chloride: 98 mmol/L (ref 98–111)
Creatinine, Ser: 1.46 mg/dL — ABNORMAL HIGH (ref 0.61–1.24)
GFR, Estimated: 51 mL/min — ABNORMAL LOW (ref >60)
Glucose, Bld: 124 mg/dL — ABNORMAL HIGH (ref 70–99)
Potassium: 4.4 mmol/L (ref 3.5–5.1)
Sodium: 135 mmol/L (ref 135–145)
Total Bilirubin: 1 mg/dL (ref 0.3–1.2)
Total Protein: 7.6 g/dL (ref 6.5–8.1)

## 2023-04-11 LAB — URINALYSIS, W/ REFLEX TO CULTURE (INFECTION SUSPECTED)
Bacteria, UA: NONE SEEN
Bilirubin Urine: NEGATIVE
Glucose, UA: NEGATIVE mg/dL
Hgb urine dipstick: NEGATIVE
Ketones, ur: NEGATIVE mg/dL
Leukocytes,Ua: NEGATIVE
Nitrite: NEGATIVE
Protein, ur: NEGATIVE mg/dL
Specific Gravity, Urine: 1.021 (ref 1.005–1.030)
pH: 6 (ref 5.0–8.0)

## 2023-04-11 LAB — CBC WITH DIFFERENTIAL/PLATELET
Abs Immature Granulocytes: 0.07 10*3/uL (ref 0.00–0.07)
Basophils Absolute: 0.1 10*3/uL (ref 0.0–0.1)
Basophils Relative: 0 %
Eosinophils Absolute: 0 10*3/uL (ref 0.0–0.5)
Eosinophils Relative: 0 %
HCT: 39.3 % (ref 39.0–52.0)
Hemoglobin: 13.2 g/dL (ref 13.0–17.0)
Immature Granulocytes: 1 %
Lymphocytes Relative: 6 %
Lymphs Abs: 0.9 10*3/uL (ref 0.7–4.0)
MCH: 30.6 pg (ref 26.0–34.0)
MCHC: 33.6 g/dL (ref 30.0–36.0)
MCV: 91 fL (ref 80.0–100.0)
Monocytes Absolute: 1.4 10*3/uL — ABNORMAL HIGH (ref 0.1–1.0)
Monocytes Relative: 10 %
Neutro Abs: 11.2 10*3/uL — ABNORMAL HIGH (ref 1.7–7.7)
Neutrophils Relative %: 83 %
Platelets: 243 10*3/uL (ref 150–400)
RBC: 4.32 MIL/uL (ref 4.22–5.81)
RDW: 12.3 % (ref 11.5–15.5)
WBC: 13.6 10*3/uL — ABNORMAL HIGH (ref 4.0–10.5)
nRBC: 0 % (ref 0.0–0.2)

## 2023-04-11 LAB — MAGNESIUM: Magnesium: 1.8 mg/dL (ref 1.7–2.4)

## 2023-04-11 LAB — LACTIC ACID, PLASMA
Lactic Acid, Venous: 1.3 mmol/L (ref 0.5–1.9)
Lactic Acid, Venous: 1.2 mmol/L (ref 0.5–1.9)

## 2023-04-11 LAB — PROTIME-INR
INR: 1.2 (ref 0.8–1.2)
Prothrombin Time: 15.6 seconds — ABNORMAL HIGH (ref 11.4–15.2)

## 2023-04-11 LAB — BRAIN NATRIURETIC PEPTIDE: B Natriuretic Peptide: 69 pg/mL (ref 0.0–100.0)

## 2023-04-11 LAB — TROPONIN I (HIGH SENSITIVITY)
Troponin I (High Sensitivity): 9 ng/L (ref <18)
Troponin I (High Sensitivity): 9 ng/L (ref <18)

## 2023-04-11 LAB — RESP PANEL BY RT-PCR (RSV, FLU A&B, COVID)  RVPGX2
Influenza A by PCR: NEGATIVE
Influenza B by PCR: NEGATIVE
Resp Syncytial Virus by PCR: NEGATIVE
SARS Coronavirus 2 by RT PCR: NEGATIVE

## 2023-04-11 LAB — MRSA NEXT GEN BY PCR, NASAL: MRSA by PCR Next Gen: NOT DETECTED

## 2023-04-11 LAB — PHOSPHORUS: Phosphorus: 2.1 mg/dL — ABNORMAL LOW (ref 2.5–4.6)

## 2023-04-11 LAB — APTT: aPTT: 32 seconds (ref 24–36)

## 2023-04-11 LAB — D-DIMER, QUANTITATIVE: D-Dimer, Quant: 1.37 ug/mL-FEU — ABNORMAL HIGH (ref 0.00–0.50)

## 2023-04-11 LAB — HIV ANTIBODY (ROUTINE TESTING W REFLEX): HIV Screen 4th Generation wRfx: NONREACTIVE

## 2023-04-11 LAB — LIPASE, BLOOD: Lipase: 26 U/L (ref 11–51)

## 2023-04-11 MED ORDER — TRAZODONE HCL 50 MG PO TABS: 25.0000 mg | ORAL_TABLET | Freq: Every evening | ORAL | Status: DC | PRN

## 2023-04-11 MED ORDER — OXYCODONE HCL 5 MG PO TABS: 5.0000 mg | ORAL_TABLET | ORAL | Status: DC | PRN

## 2023-04-11 MED ORDER — SODIUM CHLORIDE 0.9 % IV SOLN: 2.0000 g | INTRAVENOUS | Status: DC

## 2023-04-11 MED ORDER — BISACODYL 5 MG PO TBEC: 5.0000 mg | DELAYED_RELEASE_TABLET | Freq: Every day | ORAL | Status: DC | PRN

## 2023-04-11 MED ORDER — HYDROMORPHONE HCL 1 MG/ML IJ SOLN: 0.5000 mg | INTRAMUSCULAR | Status: DC | PRN

## 2023-04-11 MED ORDER — LACTATED RINGERS IV BOLUS (SEPSIS)
1000.0000 mL | Freq: Once | INTRAVENOUS | Status: AC
  Administered 2023-04-11: 1000 mL via INTRAVENOUS

## 2023-04-11 MED ORDER — IPRATROPIUM BROMIDE 0.02 % IN SOLN
0.5000 mg | Freq: Four times a day (QID) | RESPIRATORY_TRACT | Status: DC | PRN
  Administered 2023-04-11 – 2023-04-12 (×2): 0.5 mg via RESPIRATORY_TRACT
  Filled 2023-04-11 (×2): qty 2.5

## 2023-04-11 MED ORDER — SODIUM CHLORIDE 0.9% FLUSH
3.0000 mL | Freq: Two times a day (BID) | INTRAVENOUS | Status: DC
  Administered 2023-04-11 – 2023-04-12 (×3): 3 mL via INTRAVENOUS

## 2023-04-11 MED ORDER — IPRATROPIUM-ALBUTEROL 0.5-2.5 (3) MG/3ML IN SOLN
3.0000 mL | Freq: Once | RESPIRATORY_TRACT | Status: AC
  Administered 2023-04-11: 3 mL via RESPIRATORY_TRACT
  Filled 2023-04-11: qty 3

## 2023-04-11 MED ORDER — SODIUM CHLORIDE 0.9 % IV SOLN
2.0000 g | INTRAVENOUS | Status: DC
  Administered 2023-04-12: 2 g via INTRAVENOUS
  Filled 2023-04-11: qty 20

## 2023-04-11 MED ORDER — ACETAMINOPHEN 500 MG PO TABS
500.0000 mg | ORAL_TABLET | Freq: Once | ORAL | Status: AC
  Administered 2023-04-11: 500 mg via ORAL
  Filled 2023-04-11: qty 1

## 2023-04-11 MED ORDER — ALPRAZOLAM 0.5 MG PO TABS: 0.5000 mg | ORAL_TABLET | Freq: Three times a day (TID) | ORAL | Status: DC | PRN

## 2023-04-11 MED ORDER — LEVALBUTEROL HCL 0.63 MG/3ML IN NEBU
0.6300 mg | INHALATION_SOLUTION | Freq: Four times a day (QID) | RESPIRATORY_TRACT | Status: DC | PRN
  Administered 2023-04-11 – 2023-04-12 (×2): 0.63 mg via RESPIRATORY_TRACT
  Filled 2023-04-11 (×2): qty 3

## 2023-04-11 MED ORDER — ACETAMINOPHEN 325 MG PO TABS: 650.0000 mg | ORAL_TABLET | Freq: Once | ORAL | Status: DC

## 2023-04-11 MED ORDER — ONDANSETRON HCL 4 MG/2ML IJ SOLN: 4.0000 mg | Freq: Four times a day (QID) | INTRAMUSCULAR | Status: DC | PRN

## 2023-04-11 MED ORDER — CHLORHEXIDINE GLUCONATE CLOTH 2 % EX PADS
6.0000 | MEDICATED_PAD | Freq: Every day | CUTANEOUS | Status: DC
  Administered 2023-04-11 – 2023-04-12 (×2): 6 via TOPICAL

## 2023-04-11 MED ORDER — ROSUVASTATIN CALCIUM 5 MG PO TABS
5.0000 mg | ORAL_TABLET | Freq: Every day | ORAL | Status: DC
  Administered 2023-04-11 – 2023-04-12 (×2): 5 mg via ORAL
  Filled 2023-04-11 (×2): qty 1

## 2023-04-11 MED ORDER — SODIUM CHLORIDE 0.9 % IV SOLN
500.0000 mg | INTRAVENOUS | Status: DC
  Administered 2023-04-12: 500 mg via INTRAVENOUS
  Filled 2023-04-11: qty 5

## 2023-04-11 MED ORDER — FLEET ENEMA 7-19 GM/118ML RE ENEM: 1.0000 | ENEMA | Freq: Once | RECTAL | Status: DC | PRN

## 2023-04-11 MED ORDER — SODIUM CHLORIDE 0.9 % IV SOLN: 500.0000 mg | INTRAVENOUS | Status: DC

## 2023-04-11 MED ORDER — SODIUM CHLORIDE 0.9 % IV SOLN
1.0000 g | Freq: Once | INTRAVENOUS | Status: AC
  Administered 2023-04-11: 1 g via INTRAVENOUS
  Filled 2023-04-11: qty 10

## 2023-04-11 MED ORDER — ONDANSETRON HCL 4 MG PO TABS: 4.0000 mg | ORAL_TABLET | Freq: Four times a day (QID) | ORAL | Status: DC | PRN

## 2023-04-11 MED ORDER — ACETAMINOPHEN 325 MG PO TABS
650.0000 mg | ORAL_TABLET | Freq: Four times a day (QID) | ORAL | Status: DC | PRN
  Administered 2023-04-11 – 2023-04-12 (×2): 650 mg via ORAL
  Filled 2023-04-11 (×2): qty 2

## 2023-04-11 MED ORDER — SODIUM CHLORIDE 0.9 % IV SOLN: 250.0000 mL | INTRAVENOUS | Status: DC | PRN

## 2023-04-11 MED ORDER — MOMETASONE FURO-FORMOTEROL FUM 100-5 MCG/ACT IN AERO
2.0000 | INHALATION_SPRAY | Freq: Two times a day (BID) | RESPIRATORY_TRACT | Status: DC
  Administered 2023-04-11 – 2023-04-12 (×2): 2 via RESPIRATORY_TRACT
  Filled 2023-04-11: qty 8.8

## 2023-04-11 MED ORDER — ACETAMINOPHEN 500 MG PO TABS
500.0000 mg | ORAL_TABLET | Freq: Once | ORAL | Status: AC
  Administered 2023-04-11: 500 mg via ORAL

## 2023-04-11 MED ORDER — HYDRALAZINE HCL 20 MG/ML IJ SOLN
10.0000 mg | INTRAMUSCULAR | Status: DC | PRN
  Administered 2023-04-11: 10 mg via INTRAVENOUS
  Filled 2023-04-11: qty 1

## 2023-04-11 MED ORDER — ASPIRIN 81 MG PO TBEC
81.0000 mg | DELAYED_RELEASE_TABLET | Freq: Every day | ORAL | Status: DC
  Administered 2023-04-11 – 2023-04-12 (×2): 81 mg via ORAL
  Filled 2023-04-11 (×2): qty 1

## 2023-04-11 MED ORDER — HEPARIN SODIUM (PORCINE) 5000 UNIT/ML IJ SOLN
5000.0000 [IU] | Freq: Three times a day (TID) | INTRAMUSCULAR | Status: DC
  Administered 2023-04-11 – 2023-04-12 (×3): 5000 [IU] via SUBCUTANEOUS
  Filled 2023-04-11 (×3): qty 1

## 2023-04-11 MED ORDER — ACETAMINOPHEN 650 MG RE SUPP: 650.0000 mg | Freq: Four times a day (QID) | RECTAL | Status: DC | PRN

## 2023-04-11 MED ORDER — SODIUM CHLORIDE 0.9% FLUSH: 3.0000 mL | INTRAVENOUS | Status: DC | PRN

## 2023-04-11 MED ORDER — IOHEXOL 350 MG/ML SOLN
75.0000 mL | Freq: Once | INTRAVENOUS | Status: AC | PRN
  Administered 2023-04-11: 75 mL via INTRAVENOUS

## 2023-04-11 MED ORDER — LACTATED RINGERS IV SOLN
150.0000 mL/h | INTRAVENOUS | Status: AC
  Administered 2023-04-11: 150 mL/h via INTRAVENOUS

## 2023-04-11 MED ORDER — SENNOSIDES-DOCUSATE SODIUM 8.6-50 MG PO TABS: 1.0000 | ORAL_TABLET | Freq: Every evening | ORAL | Status: DC | PRN

## 2023-04-11 MED ORDER — SODIUM CHLORIDE 0.9 % IV SOLN
500.0000 mg | Freq: Once | INTRAVENOUS | Status: AC
  Administered 2023-04-11: 500 mg via INTRAVENOUS
  Filled 2023-04-11: qty 5

## 2023-04-11 MED ORDER — ACETAMINOPHEN 500 MG PO TABS
ORAL_TABLET | ORAL | Status: AC
  Filled 2023-04-11: qty 1

## 2023-04-11 MED ORDER — SODIUM CHLORIDE 0.9 % IV BOLUS
500.0000 mL | Freq: Once | INTRAVENOUS | Status: AC
  Administered 2023-04-11: 500 mL via INTRAVENOUS

## 2023-04-11 MED ORDER — LACTATED RINGERS IV BOLUS (SEPSIS)
500.0000 mL | Freq: Once | INTRAVENOUS | Status: AC
  Administered 2023-04-11: 500 mL via INTRAVENOUS

## 2023-04-11 NOTE — Assessment & Plan Note (Addendum)
Sepsis, borderline severe. On presentation pt is febrile, tachycardic, had tachypnea, leukocytosis to 13.2k, an AKI with Cr to 1.46 (baseline 0.6-0.9). CXR findings c/w R upper lobe infiltrate. Pt also seen to have elevated d-dimer to 1.37, CTA Chest without evidence of clot but was c/w multifocal pneumonia. Treating as sepsis 2/2 pneumonia. No hypotension. Serial Troponin, BNP, and lactate wnl. - LR boluses totaling 3.5L - Was started on Azithromycin and Rocephin on 6/25, will continue.

## 2023-04-11 NOTE — H&P (Addendum)
History and Physical   Patient: Cesar Gonzalez                            PCP: Assunta Found, MD                    DOB: 05/15/1951            DOA: 04/11/2023 MVH:[id number]             DOS: 04/11/2023, 3:08 PM  Assunta Found, MD  Patient coming from:   HOME  I have personally reviewed patient's medical records, in electronic medical records, including:  [facility], and care everywhere.    Chief Complaint:   Chief Complaint  Patient presents with   Shortness of Breath    History of present illness:    Cesar Gonzalez is a 72 y/o male with COPD, prior tobacco abuse, prior CAP, anxiety, HTN, and HLD who presented with worsening SOB and fevers.   Cesar Gonzalez notes that he was in his usual state of health until Sunday, 6/23, when he developed fevers up to high 103s. He also began feeling SOB and states that this worsened over the past two days. His fevers responded mildly to Tylenol over the past two days, coming down to 100-101. He also endorses a light headache over the same time period. He otherwise denies cough, sputum production, chest pain, changes in vision, abdominal pain, and changes in urination frequency or character.   Cesar Gonzalez has a history of mild COPD for which he takes Symbicort BID and albuterol prn. 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   During his ED visit, he was seen to be febrile, tachycardic, had tachypnea, leukocytosis to 13.2k, an AKI with Cr to 1.46 (baseline 0.6-0.9), CXR findings c/w R upper lobe infiltrate. Pt also seen to have elevated d-dimer to 1.37, CTA Chest without evidence of clot but was c/w multifocal pneumonia. Was started on Azithromycin and Rocephin on 6/25 in ED.  ED Course:   Blood pressure (!) 118/56, pulse (!) 107, temperature (!) 100.8 F (38.2 C), temperature source Oral, resp. rate 15, height 6\' 2"  (1.88 m), weight 100.2 kg, SpO2 92  %. Abnormal labs;   Review of Systems: As per HPI, otherwise 10 point review of systems were negative.   ----------------------------------------------------------------------------------------------------------------------  No Known Allergies  Home MEDs:  Prior to Admission medications   Medication Sig Start Date End Date Taking? Authorizing Provider  albuterol (PROVENTIL HFA;VENTOLIN HFA) 108 (90 Base) MCG/ACT inhaler Inhale 2 puffs into the lungs every 4 (four) hours as needed for wheezing or shortness of breath. 09/18/18   Johnson, Clanford L, MD  ALPRAZolam Prudy Feeler) 1 MG tablet Take 1 mg by mouth 3 (three) times daily as needed. 02/26/22   [provider]  Apple Cider Vinegar 500 MG TABS daily.    [provider]  aspirin EC 81 MG tablet Take 81 mg by mouth daily. Swallow whole.    [provider]  budesonide-formoterol (SYMBICORT) 80-4.5 MCG/ACT inhaler 2 puffs in the morning and at bedtime.    [provider]  colchicine 0.6 MG tablet Take 0.6 mg by mouth daily as needed (gout flare up).    [provider]  Multiple Vitamins-Minerals (SUPER MULTI-VITAMIN PO) Take 1 Package by mouth daily.    [provider]  olmesartan (  BENICAR) 40 MG tablet Take 40 mg by mouth daily.    [provider]  Omega-3 Fatty Acids (FISH OIL) 1000 MG CAPS Take 1,000 mg by mouth daily.    [provider]  predniSONE (DELTASONE) 10 MG tablet Take 10 mg by mouth daily as needed. 10/18/22   [provider]  rosuvastatin (CRESTOR) 5 MG tablet Take 5 mg by mouth daily.    [provider]    PRN MEDs: sodium chloride, acetaminophen **OR** acetaminophen, ALPRAZolam, bisacodyl, hydrALAZINE, HYDROmorphone (DILAUDID) injection, ipratropium, levalbuterol, ondansetron **OR** ondansetron (ZOFRAN) IV, oxyCODONE, senna-docusate, sodium chloride flush, sodium phosphate, traZODone  Past Medical History:  Diagnosis Date   Anxiety    COPD  (chronic obstructive pulmonary disease) (HCC) 09/17/2018   Essential hypertension    Gout    Mixed hyperlipidemia    Nephritis    Pneumonia     Past Surgical History:  Procedure Laterality Date   TONSILLECTOMY       reports that he quit smoking about 2 years ago. His smoking use included cigarettes. He has a 20.00 pack-year smoking history. He has never used smokeless tobacco. He reports that he does not currently use alcohol. He reports that he does not use drugs.   Family History  Problem Relation Age of Onset   Diabetes Mother    Osteoporosis Mother    COPD Father    Prostate cancer Father    Stroke Brother    Heart failure Brother    Heart attack Maternal Grandfather    Brain cancer Paternal Grandmother    Stroke Paternal Grandfather     Physical Exam:   Vitals:   04/11/23 1036 04/11/23 1045 04/11/23 1333 04/11/23 1457  BP:  (!) 118/56 (!) 108/56   Pulse:  (!) 107 94   Resp:  15 20   Temp:   98.4 F (36.9 C) 98.2 F (36.8 C)  TempSrc:   Oral Oral  SpO2:  92% 92%   Weight: 100.2 kg   98.9 kg  Height: 6\' 2"  (1.88 m)   6\' 2"  (1.88 m)   Constitutional: NAD, calm, comfortable Eyes: PERRL, lids and conjunctivae normal ENMT: Mucous membranes are moist. Posterior pharynx clear of any exudate or lesions.Normal dentition.  Neck: normal, supple, no masses, no thyromegaly Respiratory: Crackles over L lung base. Normal respiratory effort. No accessory muscle use.  Cardiovascular: Regular rate and rhythm, no murmurs / rubs / gallops. No extremity edema. 2+ pedal pulses. No carotid bruits.  Abdomen: no tenderness, no masses palpated. No hepatosplenomegaly. Bowel sounds positive.  Musculoskeletal: no clubbing / cyanosis. No joint deformity upper and lower extremities. Good ROM, no contractures. Normal muscle tone.  Neurologic: CN II-XII grossly intact. Sensation intact, DTR normal. Strength 5/5 in all 4.  Psychiatric: Normal judgment and insight. Alert and oriented x 3.  Normal mood.  Skin: no rashes, lesions, ulcers. No induration         Labs on admission:    I have personally reviewed following labs and imaging studies  CBC: Recent Labs  Lab 04/11/23 1001  WBC 13.6*  NEUTROABS 11.2*  HGB 13.2  HCT 39.3  MCV 91.0  PLT 243   Basic Metabolic Panel: Recent Labs  Lab 04/11/23 1001 04/11/23 1145  NA 135  --   K 4.4  --   CL 98  --   CO2 27  --   GLUCOSE 124*  --   BUN 26*  --   CREATININE 1.46*  --   CALCIUM  8.7*  --   MG  --  1.8  PHOS  --  2.1*   GFR: Estimated Creatinine Clearance: 59.2 mL/min (A) (by C-G formula based on SCr of 1.46 mg/dL (H)). Liver Function Tests: Recent Labs  Lab 04/11/23 1001  AST 24  ALT 20  ALKPHOS 57  BILITOT 1.0  PROT 7.6  ALBUMIN 3.8   Recent Labs  Lab 04/11/23 1001  LIPASE 26   No results for input(s): "AMMONIA" in the last 168 hours. Coagulation Profile: Recent Labs  Lab 04/11/23 1001  INR 1.2       Component Value Date/Time   COLORURINE AMBER (A) 09/15/2018 2119   APPEARANCEUR CLEAR 09/15/2018 2119   LABSPEC 1.025 09/15/2018 2119   PHURINE 5.0 09/15/2018 2119   GLUCOSEU NEGATIVE 09/15/2018 2119   HGBUR NEGATIVE 09/15/2018 2119   BILIRUBINUR SMALL (A) 09/15/2018 2119   KETONESUR NEGATIVE 09/15/2018 2119   PROTEINUR 100 (A) 09/15/2018 2119   UROBILINOGEN 0.2 12-26-14 1400   NITRITE NEGATIVE 09/15/2018 2119   LEUKOCYTESUR NEGATIVE 09/15/2018 2119    Last A1C:  No results found for: "HGBA1C"   Radiologic Exams on Admission:   CT Angio Chest PE W/Cm &/Or Wo Cm  Result Date: 04/11/2023 CLINICAL DATA:  Shortness of breath, fever EXAM: CT ANGIOGRAPHY CHEST WITH CONTRAST TECHNIQUE: Multidetector CT imaging of the chest was performed using the standard protocol during bolus administration of intravenous contrast. Multiplanar CT image reconstructions and MIPs were obtained to evaluate the vascular anatomy. RADIATION DOSE REDUCTION: This exam was performed according to the  departmental dose-optimization program which includes automated exposure control, adjustment of the mA and/or kV according to patient size and/or use of iterative reconstruction technique. CONTRAST:  75mL OMNIPAQUE IOHEXOL 350 MG/ML SOLN COMPARISON:  Previous studies including the CT done on 12/26/14 and chest radiograph done on 04/11/2023 FINDINGS: Cardiovascular: Atherosclerotic plaques and calcifications are seen in thoracic aorta and its major branches. There is homogeneous enhancement in thoracic aorta. Coronary artery calcifications are seen. There are no intraluminal filling defects in central pulmonary artery branches. Evaluation of small peripheral branches is limited by motion artifacts. Mediastinum/Nodes: There are subcentimeter nodes in mediastinum and hilar regions with no significant interval change. Lungs/Pleura: There are patchy infiltrates in the posterior segment of right upper lobe and in right lower lobe. There are smaller patchy infiltrates in left upper lobe and left lower lobe. There is minimal right pleural effusion. Upper Abdomen: No acute findings are seen. Musculoskeletal: No acute findings are seen. Review of the MIP images confirms the above findings. IMPRESSION: There is no evidence of central pulmonary artery embolism. There is no evidence of thoracic aortic dissection. Aortic atherosclerosis. Coronary artery calcifications are seen. There are patchy interstitial infiltrates in both lungs, more so in right upper lobe and right lower lobe suggesting multifocal pneumonia. Minimal right pleural effusion. Electronically Signed   By: Ernie Avena M.D.   On: 04/11/2023 12:02   DG Chest 2 View  Result Date: 04/11/2023 CLINICAL DATA:  Fever and shortness of breath. EXAM: CHEST - 2 VIEW COMPARISON:  Chest radiograph 02/09/2023 FINDINGS: The cardiomediastinal silhouette is normal. There are faint patchy opacities in the right upper lobe. There is no other focal airspace opacity.  There is no pulmonary edema. There is no pleural effusion or pneumothorax There is no acute osseous abnormality. IMPRESSION: Patchy opacities in the right upper lobe suspicious for infection. Electronically Signed   By: Lesia Hausen M.D.   On: 04/11/2023 10:52    EKG:  Independently reviewed.  Orders placed or performed during the hospital encounter of 04/11/23   ED EKG   ED EKG   EKG 12-Lead   EKG 12-Lead   EKG 12-Lead   ---------------------------------------------------------------------------------------------------------------------------------------    Assessment / Plan:   Principal Problem:   Sepsis (HCC) Active Problems:   Acute respiratory failure with hypoxia (HCC)   Community acquired pneumonia   Anxiety disorder   Hyperlipidemia   AKI (acute kidney injury) (HCC)   Assessment and Plan:   * Sepsis (HCC) Assessment & Plan Sepsis with a source of pneumonia POA: Met Sepsis criteria, borderline severe with hypotension, AKI,  On presentation pt is febrile, tachycardic, had tachypnea, leukocytosis to 13.2k, an AKI with Cr to 1.46 (baseline 0.6-0.9).  -CXR findings c/w R upper lobe infiltrate. Pt also seen to have elevated d-dimer to 1.37,  - CTA Chest without evidence of clot but was c/w multifocal pneumonia. Treating as sepsis 2/2 pneumonia. No hypotension. Serial Troponin, BNP, and lactate wnl. -Sepsis pathway was initiated in ED with IV fluid resuscitation, broad-spectrum antibiotics - LR boluses totaling 3.5L -Mildly hypotensive -monitoring closely, currently stable (assess post fluid resuscitation)  - Was started on Azithromycin and Rocephin on 6/25, will continue. -Will follow-up with blood and sputum cultures accordingly     Community acquired pneumonia Assessment & Plan Febrile, leukocytosis, sepsis criteria. Pt with CXR and CTA Chest findings most consistent with multifocal pneumonia. Has a history of prior CAP and underlying COPD.  - Will continue  Azithromycin and Rocephin, started 6/25 - Ipratropium-nebulizer q6hrs prn - Mometasone-formoterol 100-100mcg BID   AKI (acute kidney injury) (HCC) Assessment & Plan AKI with Cr to 1.46 (baseline 0.6-0.9). Suspect pre-renal in the setting of sepsis. - UA - Volume repletion as above for sepsis - Avoid nephrotoxic meds - Will continue to monitor  History of anxiety -stable, will continue his as needed benzodiazepines  Hypertension  Mildly hypotensive likely due to sepsis -Reviewing home medication, holding his home medications of Benicar  hyperlipidemia -Continue home dose statins     Consults called:  None -------------------------------------------------------------------------------------------------------------------------------------------- DVT prophylaxis:  heparin injection 5,000 Units Start: 04/11/23 1400 TED hose Start: 04/11/23 1145 SCDs Start: 04/11/23 1145   Code Status:   Code Status: Full Code   Admission status: Patient will be admitted as Observation, with a greater than 2 midnight length of stay. Level of care: Stepdown   Family Communication:  none at bedside  (The above findings and plan of care has been discussed with patient in detail, the patient expressed understanding and agreement of above plan)  --------------------------------------------------------------------------------------------------------------------------------------------------  Disposition Plan:  Anticipated 1-2 days Status is: Inpatient Remains inpatient appropriate because: Borderline severe sepsis.     ----------------------------------------------------------------------------------------------------------------------------------------------------  Time spent:  36  Min.  Was spent seeing and evaluating the patient, reviewing all medical records, drawn plan of care.  Marcelline Mates, MS4 Working with Dr. Nevin Bloodgood   The patient was seen and examined independently,  all medical records, labs, current plan of care was reviewed drawn and discussed with medical students in detail   SIGNED: Kendell Bane, MD, [facility]. FAAFP [facility],  Pager (please use [email or web address] to page/text)  Please use Epic Secure Chat for non-urgent communication (7AM-7PM) If 7PM-7AM, please contact night-coverage [email or web address],  04/11/2023, 3:37 PM

## 2023-04-11 NOTE — Assessment & Plan Note (Addendum)
Febrile, leukocytosis, sepsis criteria. Pt with CXR and CTA Chest findings most consistent with multifocal pneumonia. Has a history of prior CAP and underlying COPD.  - Will continue Azithromycin and Rocephin, started 6/25 - Ipratropium-nebulizer q6hrs prn - Mometasone-formoterol 100-59mcg BID

## 2023-04-11 NOTE — ED Notes (Signed)
Patient transported to X-ray 

## 2023-04-11 NOTE — Hospital Course (Addendum)
Cesar Gonzalez is a 72 y/o male with COPD, prior tobacco abuse, prior CAP, anxiety, HTN, and HLD who presented with worsening SOB and fevers.   Cesar Gonzalez notes that he was in his usual state of health until Sunday, 6/23, when he developed fevers up to high 103s. He also began feeling SOB and states that this worsened over the past two days. His fevers responded mildly to Tylenol over the past two days, coming down to 100-101. He also endorses a light headache over the same time period. He otherwise denies cough, sputum production, chest pain, changes in vision, abdominal pain, and changes in urination frequency or character.   Cesar Gonzalez has a history of mild COPD for which he takes Symbicort BID and albuterol prn. 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   During his ED visit, he was seen to be febrile, tachycardic, had tachypnea, leukocytosis to 13.2k, an AKI with Cr to 1.46 (baseline 0.6-0.9), CXR findings c/w R upper lobe infiltrate. Pt also seen to have elevated d-dimer to 1.37, CTA Chest without evidence of clot but was c/w multifocal pneumonia. Was started on Azithromycin and Rocephin on 6/25 in ED.

## 2023-04-11 NOTE — Sepsis Progress Note (Signed)
Elink monitoring for the code sepsis protocol.  

## 2023-04-11 NOTE — Sepsis Progress Note (Signed)
Notified bedside nurse of need to administer fluid bolus.  

## 2023-04-11 NOTE — ED Triage Notes (Signed)
Pt states he was out working in the yard on Saturday and Sunday he woke up with fever of 103.6 and sob  Pt states he has continued to have a fever everyday and last took acetaminophen at 6am today  Pt c/o continued sob

## 2023-04-11 NOTE — ED Provider Notes (Signed)
[place] EMERGENCY DEPARTMENT AT [facility] Provider Note   CSN: [id number] Arrival date & time: 04/11/23  4098     History  Chief Complaint  Patient presents with   Shortness of Breath   HPI Cesar Gonzalez is a 72 y.o. male with COPD, hypertension hyperlipidemia presenting for shortness of breath and fever.  Symptoms started on Saturday.  Woke up with a fever of 103.6 and shortness of breath. Shortness of breath is worse with exertion.  Denies orthopnea.  Denies chest pain.  States in the last couple days his fever has hovered around 100-101. Denies calf tenderness, history of malignancy.  States he has been taking Tylenol for symptoms.  Last took Tylenol at 6 AM this morning.  Denies abdominal pain, nausea, vomiting and diarrhea. Denies urinary symptoms.  HPI     Home Medications Prior to Admission medications   Medication Sig Start Date End Date Taking? Authorizing Provider  albuterol (PROVENTIL HFA;VENTOLIN HFA) 108 (90 Base) MCG/ACT inhaler Inhale 2 puffs into the lungs every 4 (four) hours as needed for wheezing or shortness of breath. 09/18/18   Johnson, Clanford L, MD  ALPRAZolam Prudy Feeler) 1 MG tablet Take 1 mg by mouth 3 (three) times daily as needed. 02/26/22   [provider]  Apple Cider Vinegar 500 MG TABS daily.    [provider]  aspirin EC 81 MG tablet Take 81 mg by mouth daily. Swallow whole.    [provider]  budesonide-formoterol (SYMBICORT) 80-4.5 MCG/ACT inhaler 2 puffs in the morning and at bedtime.    [provider]  colchicine 0.6 MG tablet Take 0.6 mg by mouth daily as needed (gout flare up).    [provider]  Multiple Vitamins-Minerals (SUPER MULTI-VITAMIN PO) Take 1 Package by mouth daily.    [provider]  olmesartan (BENICAR) 40 MG tablet Take 40 mg by mouth daily.    [provider]  Omega-3 Fatty Acids (FISH OIL) 1000 MG CAPS Take 1,000 mg by mouth daily.    [provider]  predniSONE (DELTASONE) 10 MG tablet Take 10 mg by mouth daily as needed. 10/18/22   [provider]  rosuvastatin (CRESTOR) 5 MG tablet Take 5 mg by mouth daily.    [provider]      Allergies    Patient has no known allergies.    Review of Systems   See HPI for pertinent positives   Physical Exam   Vitals:   04/11/23 1030 04/11/23 1045  BP: 117/66 (!) 118/56  Pulse: (!) 110 (!) 107  Resp: (!) 26 15  Temp: (!) 100.8 F (38.2 C)   SpO2: 92% 92%    CONSTITUTIONAL:  well-appearing, NAD NEURO:  Alert and oriented x 3, CN 3-12 grossly intact EYES:  eyes equal and reactive ENT/NECK:  Supple, no stridor  CARDIO:  Regular rate and rhythm, appears well-perfused  PULM:  No respiratory distress, subtle wheezing and coarse BS, mild diminished in the bases GI/GU:  non-distended, soft MSK/SPINE:  No gross deformities, no edema, moves all extremities  SKIN:  no rash, atraumatic  *Additional and/or pertinent findings included in MDM below   ED Results / Procedures / Treatments   Labs (all labs ordered are listed, but only abnormal results are displayed) Labs Reviewed  D-DIMER, QUANTITATIVE - Abnormal; Notable for the following components:      Result Value   D-Dimer, Quant 1.37 (*)    All other components within normal limits  CBC WITH DIFFERENTIAL/PLATELET - Abnormal; Notable for the following components:   WBC 13.6 (*)    Neutro Abs 11.2 (*)    Monocytes Absolute 1.4 (*)    All other components within normal limits  COMPREHENSIVE METABOLIC PANEL - Abnormal; Notable for the following components:   Glucose, Bld 124 (*)    BUN 26 (*)    Creatinine, Ser 1.46 (*)    Calcium 8.7 (*)    GFR, Estimated 51 (*)    All other components within normal limits  PROTIME-INR - Abnormal; Notable for the following components:   Prothrombin Time 15.6 (*)    All other components within normal limits  RESP PANEL BY RT-PCR (RSV, FLU A&B, COVID)  [id number]   CULTURE, BLOOD (ROUTINE X 2)  CULTURE, BLOOD (ROUTINE X 2)  EXPECTORATED SPUTUM ASSESSMENT W GRAM STAIN, RFLX TO RESP C  BRAIN NATRIURETIC PEPTIDE  LACTIC ACID, PLASMA  APTT  LIPASE, BLOOD  LACTIC ACID, PLASMA  URINALYSIS, W/ REFLEX TO CULTURE (INFECTION SUSPECTED)  HIV ANTIBODY (ROUTINE TESTING W REFLEX)  MAGNESIUM  PHOSPHORUS  TROPONIN I (HIGH SENSITIVITY)  TROPONIN I (HIGH SENSITIVITY)    EKG EKG Interpretation  Date/Time:  Tuesday April 11 2023 09:28:48 EDT Ventricular Rate:  115 PR Interval:  167 QRS Duration: 103 QT Interval:  319 QTC Calculation: 442 R Axis:   6 Text Interpretation: Sinus tachycardia Since last tracing rate faster Confirmed by Eber Hong ([id number]) on 04/11/2023 9:53:44 AM  Radiology CT Angio Chest PE W/Cm &/Or Wo Cm  Result Date: 04/11/2023 CLINICAL DATA:  Shortness of breath, fever EXAM: CT ANGIOGRAPHY CHEST WITH CONTRAST TECHNIQUE: Multidetector CT imaging of the chest was performed using the standard protocol during bolus administration of intravenous contrast. Multiplanar CT image reconstructions and MIPs were obtained to evaluate the vascular anatomy. RADIATION DOSE REDUCTION: This exam was performed according to the departmental dose-optimization program which includes automated exposure control, adjustment of the mA and/or kV according to patient size and/or use of iterative reconstruction technique. CONTRAST:  75mL OMNIPAQUE IOHEXOL 350 MG/ML SOLN COMPARISON:  Previous studies including the CT done on 01/09/2015 and chest radiograph done on 04/11/2023 FINDINGS: Cardiovascular: Atherosclerotic plaques and calcifications are seen in thoracic aorta and its major branches. There is homogeneous enhancement in thoracic aorta. Coronary artery calcifications are seen. There are no intraluminal filling defects in central pulmonary artery branches. Evaluation of small peripheral branches is limited by motion artifacts. Mediastinum/Nodes: There are subcentimeter  nodes in mediastinum and hilar regions with no significant interval change. Lungs/Pleura: There are patchy infiltrates in the posterior segment of right upper lobe and in right lower lobe. There are smaller patchy infiltrates in left upper lobe and left lower lobe. There is minimal right pleural effusion. Upper Abdomen: No acute findings are seen. Musculoskeletal: No acute findings are seen. Review of the MIP images confirms the above findings. IMPRESSION: There is no evidence of central pulmonary artery embolism. There is no evidence of thoracic aortic dissection. Aortic atherosclerosis. Coronary artery calcifications are seen. There are patchy interstitial infiltrates in both lungs, more so in right upper lobe and right lower lobe suggesting multifocal pneumonia. Minimal right pleural effusion. Electronically Signed   By: Ernie Avena M.D.   On: 04/11/2023 12:02   DG Chest 2 View  Result Date: 04/11/2023 CLINICAL DATA:  Fever and shortness of breath. EXAM: CHEST - 2 VIEW COMPARISON:  Chest radiograph 02/09/2023 FINDINGS: The cardiomediastinal silhouette is normal. There are faint patchy opacities in the right upper lobe. There  is no other focal airspace opacity. There is no pulmonary edema. There is no pleural effusion or pneumothorax There is no acute osseous abnormality. IMPRESSION: Patchy opacities in the right upper lobe suspicious for infection. Electronically Signed   By: Lesia Hausen M.D.   On: 04/11/2023 10:52    Procedures .Critical Care  Performed by: Gareth Eagle, PA-C Authorized by: Gareth Eagle, PA-C   Critical care provider statement:    Critical care time (minutes):  30   Critical care was necessary to treat or prevent imminent or life-threatening deterioration of the following conditions:  Sepsis   Critical care was time spent personally by me on the following activities:  Development of treatment plan with patient or surrogate, discussions with consultants, evaluation  of patient's response to treatment, examination of patient, ordering and review of laboratory studies, ordering and review of radiographic studies, ordering and performing treatments and interventions, pulse oximetry, re-evaluation of patient's condition and review of old charts     Medications Ordered in ED Medications  heparin injection 5,000 Units (has no administration in time range)  sodium chloride flush (NS) 0.9 % injection 3 mL (3 mLs Intravenous Given 04/11/23 1200)  sodium chloride flush (NS) 0.9 % injection 3 mL (3 mLs Intravenous Given 04/11/23 1201)  sodium chloride flush (NS) 0.9 % injection 3 mL (has no administration in time range)  0.9 %  sodium chloride infusion (has no administration in time range)  acetaminophen (TYLENOL) tablet 650 mg (has no administration in time range)    Or  acetaminophen (TYLENOL) suppository 650 mg (has no administration in time range)  oxyCODONE (Oxy IR/ROXICODONE) immediate release tablet 5 mg (has no administration in time range)  HYDROmorphone (DILAUDID) injection 0.5-1 mg (has no administration in time range)  traZODone (DESYREL) tablet 25 mg (has no administration in time range)  senna-docusate (Senokot-S) tablet 1 tablet (has no administration in time range)  bisacodyl (DULCOLAX) EC tablet 5 mg (has no administration in time range)  sodium phosphate (FLEET) 7-19 GM/118ML enema 1 enema (has no administration in time range)  ondansetron (ZOFRAN) tablet 4 mg (has no administration in time range)    Or  ondansetron (ZOFRAN) injection 4 mg (has no administration in time range)  ipratropium (ATROVENT) nebulizer solution 0.5 mg (has no administration in time range)  levalbuterol (XOPENEX) nebulizer solution 0.63 mg (has no administration in time range)  hydrALAZINE (APRESOLINE) injection 10 mg (has no administration in time range)  lactated ringers infusion (has no administration in time range)  lactated ringers bolus 1,000 mL (has no  administration in time range)    And  lactated ringers bolus 1,000 mL (has no administration in time range)    And  lactated ringers bolus 1,000 mL (has no administration in time range)    And  lactated ringers bolus 500 mL (has no administration in time range)  cefTRIAXone (ROCEPHIN) 2 g in sodium chloride 0.9 % 100 mL IVPB (has no administration in time range)  azithromycin (ZITHROMAX) 500 mg in sodium chloride 0.9 % 250 mL IVPB (has no administration in time range)  aspirin EC tablet 81 mg (has no administration in time range)  rosuvastatin (CRESTOR) tablet 5 mg (has no administration in time range)  ALPRAZolam (XANAX) tablet 0.5 mg (has no administration in time range)  mometasone-formoterol (DULERA) 100-5 MCG/ACT inhaler 2 puff (has no administration in time range)  ipratropium-albuterol (DUONEB) 0.5-2.5 (3) MG/3ML nebulizer solution 3 mL (3 mLs Nebulization Given 04/11/23 1000)  cefTRIAXone (ROCEPHIN)  1 g in sodium chloride 0.9 % 100 mL IVPB (0 g Intravenous Stopped 04/11/23 1159)  azithromycin (ZITHROMAX) 500 mg in sodium chloride 0.9 % 250 mL IVPB (500 mg Intravenous New Bag/Given 04/11/23 1041)  acetaminophen (TYLENOL) tablet 500 mg (500 mg Oral Given 04/11/23 1016)  acetaminophen (TYLENOL) tablet 500 mg (500 mg Oral Given 04/11/23 1151)  sodium chloride 0.9 % bolus 500 mL (500 mLs Intravenous Bolus 04/11/23 1154)  iohexol (OMNIPAQUE) 350 MG/ML injection 75 mL (75 mLs Intravenous Contrast Given 04/11/23 1125)    ED Course/ Medical Decision Making/ A&P Clinical Course as of 04/11/23 1221  Tue Apr 11, 2023  1115 GFR, Estimated(!): 51 [JR]    Clinical Course User Index [JR] Gareth Eagle, PA-C                             Medical Decision Making Amount and/or Complexity of Data Reviewed Labs: ordered. Decision-making details documented in ED Course. Radiology: ordered.  Risk OTC drugs. Prescription drug management. Decision regarding hospitalization.   Initial Impression  and Ddx 86-year-old well-appearing male presenting for shortness of breath and fever.  Exam notable for tachycardia and coarse breath sounds.  DDx includes pneumonia, sepsis, ACS, PE, CHF exacerbation, COPD exacerbation. Patient PMH that increases complexity of ED encounter:  COPD, hypertension hyperlipidemia  Interpretation of Diagnostics I independent reviewed and interpreted the labs as followed: Leukocytosis, reduced GFR, elevated D-dimer  - I independently visualized the following imaging with scope of interpretation limited to determining acute life threatening conditions related to emergency care: cxr, which revealed patchy opacity in the right upper lobe  -I personally reviewed and interpreted EKG which revealed sinus tachycardia  Patient Reassessment and Ultimate Disposition/Management Workup overall concerning for sepsis secondary to pneumonia.  Started on azithromycin and Rocephin.  Labs indicate possible mild AKI.  Treated with 500 mL of normal saline.  Initially afebrile but temperature began to rise.  Treated with Tylenol.  Was initially course.  Treated with DuoNeb.  D-dimer was also elevated this could be secondary to ongoing infection but cannot rule out PE.  Prompting further characterization with CTA of his chest.  Study is pending.  Admitted to hospital for further evaluation management with Dr. Flossie Dibble.   Patient management required discussion with the following services or consulting groups:  Hospitalist Service  Complexity of Problems Addressed Acute complicated illness or Injury  Additional Data Reviewed and Analyzed Further history obtained from: Past medical history and medications listed in the EMR and Prior ED visit notes  Patient Encounter Risk Assessment Consideration of hospitalization         Final Clinical Impression(s) / ED Diagnoses Final diagnoses:  Sepsis without acute organ dysfunction, due to unspecified organism [facility])  Pneumonia of right  upper lobe due to infectious organism    Rx / DC Orders ED Discharge Orders     None         Gareth Eagle, PA-C 04/11/23 1224    Eber Hong, MD 04/21/23 0710

## 2023-04-11 NOTE — Assessment & Plan Note (Addendum)
AKI with Cr to 1.46 (baseline 0.6-0.9). Suspect pre-renal in the setting of sepsis. - UA - Volume repletion as above for sepsis - Avoid nephrotoxic meds - Will continue to monitor

## 2023-04-11 NOTE — Progress Notes (Signed)
IV bolus's completed a little over an hour  ago. Noted patient blood pressure running high in the 170's sbp. Os sat on room air dipping down to 88% on room air. Patient placed on 2L O2. Patient alert and oriented x4 and stated he felt "a little short of breath." Respirations around 28. PRN hydralazine given. Dr Flossie Dibble made aware of vital signs and patient complaint. Per verbal from Dr Flossie Dibble, stop LR fluids. Fluids stopped, [place] Rn on nights made aware.

## 2023-04-12 DIAGNOSIS — A419 Sepsis, unspecified organism: Secondary | ICD-10-CM | POA: Diagnosis not present

## 2023-04-12 DIAGNOSIS — J9601 Acute respiratory failure with hypoxia: Secondary | ICD-10-CM | POA: Diagnosis not present

## 2023-04-12 DIAGNOSIS — R6520 Severe sepsis without septic shock: Secondary | ICD-10-CM | POA: Diagnosis not present

## 2023-04-12 LAB — CBC
HCT: 34.7 % — ABNORMAL LOW (ref 39.0–52.0)
Hemoglobin: 11.1 g/dL — ABNORMAL LOW (ref 13.0–17.0)
MCH: 30.2 pg (ref 26.0–34.0)
MCHC: 32 g/dL (ref 30.0–36.0)
MCV: 94.6 fL (ref 80.0–100.0)
Platelets: 212 10*3/uL (ref 150–400)
RBC: 3.67 MIL/uL — ABNORMAL LOW (ref 4.22–5.81)
RDW: 12.6 % (ref 11.5–15.5)
WBC: 10.7 10*3/uL — ABNORMAL HIGH (ref 4.0–10.5)
nRBC: 0 % (ref 0.0–0.2)

## 2023-04-12 LAB — COMPREHENSIVE METABOLIC PANEL
ALT: 23 U/L (ref 0–44)
AST: 24 U/L (ref 15–41)
Albumin: 2.8 g/dL — ABNORMAL LOW (ref 3.5–5.0)
Alkaline Phosphatase: 48 U/L (ref 38–126)
Anion gap: 10 (ref 5–15)
BUN: 19 mg/dL (ref 8–23)
CO2: 26 mmol/L (ref 22–32)
Calcium: 8.1 mg/dL — ABNORMAL LOW (ref 8.9–10.3)
Chloride: 101 mmol/L (ref 98–111)
Creatinine, Ser: 1.16 mg/dL (ref 0.61–1.24)
GFR, Estimated: >60 mL/min (ref >60)
Glucose, Bld: 97 mg/dL (ref 70–99)
Potassium: 4 mmol/L (ref 3.5–5.1)
Sodium: 137 mmol/L (ref 135–145)
Total Bilirubin: 0.5 mg/dL (ref 0.3–1.2)
Total Protein: 5.9 g/dL — ABNORMAL LOW (ref 6.5–8.1)

## 2023-04-12 LAB — GLUCOSE, CAPILLARY: Glucose-Capillary: 97 mg/dL (ref 70–99)

## 2023-04-12 MED ORDER — CEFDINIR 300 MG PO CAPS: 300.0000 mg | ORAL_CAPSULE | Freq: Two times a day (BID) | ORAL | 0 refills | Status: AC

## 2023-04-12 MED ORDER — AZITHROMYCIN 500 MG PO TABS: 500.0000 mg | ORAL_TABLET | Freq: Every day | ORAL | 0 refills | Status: AC

## 2023-04-12 NOTE — Evaluation (Signed)
Physical Therapy Evaluation Patient Details Name: Cesar Gonzalez MRN: [id number] DOB: 08-Sep-1951 Today's Date: 04/12/2023  History of Present Illness  Cesar Gonzalez is a 72 y/o male with COPD, prior tobacco abuse, prior CAP, anxiety, HTN, and HLD who presented with worsening SOB and fevers.      Cesar Gonzalez notes that he was in his usual state of health until Sunday, 6/23, when he developed fevers up to high 103s. He also began feeling SOB and states that this worsened over the past two days. His fevers responded mildly to Tylenol over the past two days, coming down to 100-101. He also endorses a light headache over the same time period. He otherwise denies cough, sputum production, chest pain, changes in vision, abdominal pain, and changes in urination frequency or character.   Clinical Impression  Patient functioning near baseline for functional mobility and gait other than mild SpO2 desaturation after walking dropping to 88%, able to recover to 95% after sitting in chair, otherwise demonstrates good return for bed mobility, transfers and ambulation without loss of balance.  Plan:  Patient discharged from physical therapy to care of nursing for ambulation daily as tolerated for length of stay.         Recommendations for follow up therapy are one component of a multi-disciplinary discharge planning process, led by the attending physician.  Recommendations may be updated based on patient status, additional functional criteria and insurance authorization.  Follow Up Recommendations       Assistance Recommended at Discharge    Patient can return home with the following  Other (comment) (near baseline)    Equipment Recommendations None recommended by PT  Recommendations for Other Services       Functional Status Assessment Patient has not had a recent decline in their functional status     Precautions / Restrictions Precautions Precautions: None Restrictions Weight Bearing  Restrictions: No      Mobility  Bed Mobility Overal bed mobility: Independent                  Transfers Overall transfer level: Independent                      Ambulation/Gait Ambulation/Gait assistance: Modified independent (Device/Increase time), Independent Gait Distance (Feet): 100 Feet Assistive device: None Gait Pattern/deviations: WFL(Within Functional Limits) Gait velocity: near normal     General Gait Details: grossly WFL with good return for ambulating in room, hallways without loss of balance or need for an AD  Stairs            Wheelchair Mobility    Modified Rankin (Stroke Patients Only)       Balance Overall balance assessment: Independent                                           Pertinent Vitals/Pain Pain Assessment Pain Assessment: No/denies pain    Home Living Family/patient expects to be discharged to:: Private residence Living Arrangements: Spouse/significant other Available Help at Discharge: Family;Available PRN/intermittently Type of Home: House Home Access: Stairs to enter Entrance Stairs-Rails: Can reach both;Left;Right Entrance Stairs-Number of Steps: 3 Alternate Level Stairs-Number of Steps: 15 Home Layout: Two level Home Equipment: None      Prior Function Prior Level of Function : Independent/Modified Independent             Mobility Comments:  Pt works; 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 ADLs Comments: Independent     Hand Dominance   Dominant Hand: Right    Extremity/Trunk Assessment   Upper Extremity Assessment Upper Extremity Assessment: Defer to OT evaluation    Lower Extremity Assessment Lower Extremity Assessment: Overall WFL for tasks assessed    Cervical / Trunk Assessment Cervical / Trunk Assessment: Normal  Communication   Communication: No difficulties  Cognition Arousal/Alertness: Awake/alert Behavior During Therapy: WFL for tasks  assessed/performed Overall Cognitive Status: Within Functional Limits for tasks assessed                                          General Comments      Exercises     Assessment/Plan    PT Assessment Patient does not need any further PT services  PT Problem List         PT Treatment Interventions      PT Goals (Current goals can be found in the Care Plan section)  Acute Rehab PT Goals Patient Stated Goal: return home PT Goal Formulation: With patient Time For Goal Achievement: 04/12/23 Potential to Achieve Goals: Good    Frequency       Co-evaluation PT/OT/SLP Co-Evaluation/Treatment: Yes Reason for Co-Treatment: To address functional/ADL transfers PT goals addressed during session: Mobility/safety with mobility;Balance OT goals addressed during session: ADL's and self-care       AM-PAC PT "6 Clicks" Mobility  Outcome Measure Help needed turning from your back to your side while in a flat bed without using bedrails?: None Help needed moving from lying on your back to sitting on the side of a flat bed without using bedrails?: None Help needed moving to and from a bed to a chair (including a wheelchair)?: None Help needed standing up from a chair using your arms (e.g., wheelchair or bedside chair)?: None Help needed to walk in hospital room?: None Help needed climbing 3-5 steps with a railing? : None 6 Click Score: 24    End of Session   Activity Tolerance: Patient tolerated treatment well Patient left: in chair Nurse Communication: Mobility status PT Visit Diagnosis: Unsteadiness on feet (R26.81);Other abnormalities of gait and mobility (R26.89);Muscle weakness (generalized) (M62.81)    Time: 6962-9528 PT Time Calculation (min) (ACUTE ONLY): 19 min   Charges:   PT Evaluation $PT Eval Low Complexity: 1 Low PT Treatments $Therapeutic Activity: 8-22 mins        11:41 AM, 04/12/23 Ocie Bob, MPT Physical Therapist with  [facility] [phone] office [phone] mobile phone

## 2023-04-12 NOTE — Care Management CC44 (Signed)
Condition Code 44 Documentation Completed  Patient Details  Name: Cesar Gonzalez MRN: [id number] Date of Birth: [birth year]   Condition Code 44 given:  Yes Patient signature on Condition Code 44 notice:  Yes Documentation of 2 MD's agreement:  Yes Code 44 added to claim:  Yes    Karn Cassis, LCSW 04/12/2023, 11:05 AM

## 2023-04-12 NOTE — Discharge Summary (Signed)
Physician Discharge Summary  Cesar Gonzalez NWG:[id number] DOB: [birth year] DOA: 04/11/2023  PCP: Assunta Found, MD  Admit date: 04/11/2023  Discharge date: 04/12/2023  Admitted From: Home  Disposition:  Home  Recommendations for Outpatient Follow-up:  Follow up with PCP in 1-2 weeks Please obtain BMP/CBC in one week Please follow up on the following pending results: No pending results at time of discharge. Cesar Cesar Gonzalez will need to complete his antibiotic course with last dose of Azithromycin on Saturday, 6/29 and his last dose of Omnicef on Monday, 7/1.   Home Health: None  Equipment/Devices: None  Discharge Condition:Stable  CODE STATUS: Full  Diet recommendation: Heart Healthy  Brief/Interim Summary: Cesar Gonzalez is a 72 y/o male with COPD, prior episodes of CAP, prior tobacco abuse (37 pack year, quit 2022), HTN, HLD, and anxiety who presented with fevers and SOB and was admitted for treatment of sepsis 2/2 community acquired pneumonia.   Cesar Gonzalez presented to the ED with fevers, tachycardia, tachypnea, leukocytosis to 13.6k, AKI with Cr to 1.46 (baseline 0.8), and an elevated d-dimer to 1.37. Due to D-dimer and SOB, a PE was ruled out with CTA Chest, which did reveal patchy interstitial infiltrates most consistent with a multifocal pneumonia. On the night of his admission he developed a mild supplemental O2 requirement with 3L [place], which was brief, and returned to room air the following morning. Cesar Gonzalez was started on Ceftriaxone and Azithromycin on 6/25 and was given volume resuscitation for sepsis with 3.5L [place]. By the day of his discharge he was breathing comfortably on room air, was afebrile, WBC normalized to 10.7k, and Cr much improved to 1.16 s/p volume resuscitation.  Discharge Diagnoses:  Principal Problem:   Sepsis (HCC) 2/2 Community Acquired Pneumonia  Active Problems:   Community acquired pneumonia   Acute respiratory failure with hypoxia (HCC)   Anxiety  disorder   Hyperlipidemia   AKI (acute kidney injury) [facility])    Discharge Instructions  Discharge Instructions     Diet - low sodium heart healthy   Complete by: As directed    Increase activity slowly   Complete by: As directed       Allergies as of 04/12/2023   No Known Allergies      Medication List     TAKE these medications    albuterol 108 (90 Base) MCG/ACT inhaler Commonly known as: VENTOLIN HFA Inhale 2 puffs into the lungs every 4 (four) hours as needed for wheezing or shortness of breath.   ALPRAZolam 1 MG tablet Commonly known as: XANAX Take 1 mg by mouth 3 (three) times daily as needed for anxiety.   Apple Cider Vinegar 500 MG Tabs Take 1 tablet by mouth daily.   aspirin EC 81 MG tablet Take 81 mg by mouth daily. Swallow whole.   azithromycin 500 MG tablet Commonly known as: Zithromax Take 1 tablet (500 mg total) by mouth daily for 3 days.   cefdinir 300 MG capsule Commonly known as: OMNICEF Take 1 capsule (300 mg total) by mouth 2 (two) times daily for 5 days.   colchicine 0.6 MG tablet Take 0.6 mg by mouth daily as needed (gout flare up).   Fish Oil 1000 MG Caps Take 2,000 mg by mouth daily.   olmesartan 40 MG tablet Commonly known as: BENICAR Take 40 mg by mouth daily.   predniSONE 10 MG tablet Commonly known as: DELTASONE Take 10 mg by mouth daily as needed.   rosuvastatin 5 MG tablet Commonly known  as: CRESTOR Take 5 mg by mouth daily.   SUPER MULTI-VITAMIN PO Take 1 Package by mouth daily.   Symbicort 160-4.5 MCG/ACT inhaler Generic drug: budesonide-formoterol Inhale 2 puffs into the lungs in the morning and at bedtime.        Follow-up Information     Assunta Found, MD. Schedule an appointment as soon as possible for a visit in 1 week(s).   Specialty: Family Medicine Contact information: [address] [place] [address] [phone]                No Known  Allergies  Consultations: None   Procedures/Studies: CT Angio Chest PE W/Cm &/Or Wo Cm  Result Date: 04/11/2023 CLINICAL DATA:  Shortness of breath, fever EXAM: CT ANGIOGRAPHY CHEST WITH CONTRAST TECHNIQUE: Multidetector CT imaging of the chest was performed using the standard protocol during bolus administration of intravenous contrast. Multiplanar CT image reconstructions and MIPs were obtained to evaluate the vascular anatomy. RADIATION DOSE REDUCTION: This exam was performed according to the departmental dose-optimization program which includes automated exposure control, adjustment of the mA and/or kV according to patient size and/or use of iterative reconstruction technique. CONTRAST:  75mL OMNIPAQUE IOHEXOL 350 MG/ML SOLN COMPARISON:  Previous studies including the CT done on 01-06-2015 and chest radiograph done on 04/11/2023 FINDINGS: Cardiovascular: Atherosclerotic plaques and calcifications are seen in thoracic aorta and its major branches. There is homogeneous enhancement in thoracic aorta. Coronary artery calcifications are seen. There are no intraluminal filling defects in central pulmonary artery branches. Evaluation of small peripheral branches is limited by motion artifacts. Mediastinum/Nodes: There are subcentimeter nodes in mediastinum and hilar regions with no significant interval change. Lungs/Pleura: There are patchy infiltrates in the posterior segment of right upper lobe and in right lower lobe. There are smaller patchy infiltrates in left upper lobe and left lower lobe. There is minimal right pleural effusion. Upper Abdomen: No acute findings are seen. Musculoskeletal: No acute findings are seen. Review of the MIP images confirms the above findings. IMPRESSION: There is no evidence of central pulmonary artery embolism. There is no evidence of thoracic aortic dissection. Aortic atherosclerosis. Coronary artery calcifications are seen. There are patchy interstitial infiltrates in both  lungs, more so in right upper lobe and right lower lobe suggesting multifocal pneumonia. Minimal right pleural effusion. Electronically Signed   By: Ernie Avena M.D.   On: 04/11/2023 12:02   DG Chest 2 View  Result Date: 04/11/2023 CLINICAL DATA:  Fever and shortness of breath. EXAM: CHEST - 2 VIEW COMPARISON:  Chest radiograph 02/09/2023 FINDINGS: The cardiomediastinal silhouette is normal. There are faint patchy opacities in the right upper lobe. There is no other focal airspace opacity. There is no pulmonary edema. There is no pleural effusion or pneumothorax There is no acute osseous abnormality. IMPRESSION: Patchy opacities in the right upper lobe suspicious for infection. Electronically Signed   By: Lesia Hausen M.D.   On: 04/11/2023 10:52     Discharge Exam: Vitals:   04/12/23 0900 04/12/23 1000  BP: 124/61 131/77  Pulse: (!) 102 98  Resp: (!) 25 (!) 21  Temp:    SpO2: 91% 100%   Vitals:   04/12/23 0815 04/12/23 0841 04/12/23 0900 04/12/23 1000  BP:   124/61 131/77  Pulse:   (!) 102 98  Resp:   (!) 25 (!) 21  Temp: 98.4 F (36.9 C)     TempSrc: Oral     SpO2:  92% 91% 100%  Weight:  Height:        General: Pt is alert, awake, not in acute distress Cardiovascular: RRR, S1/S2 +, no rubs, no gallops Respiratory: Mild crackles over R base, no wheezing, no rhonchi, breathing comfortably on RA Abdominal: Soft, NT, ND, bowel sounds + Extremities: no edema, no cyanosis    The results of significant diagnostics from this hospitalization (including imaging, microbiology, ancillary and laboratory) are listed below for reference.     Microbiology: Recent Results (from the past 240 hour(s))  Resp panel by RT-PCR (RSV, Flu A&B, Covid) Anterior Nasal Swab     Status: None   Collection Time: 04/11/23  9:57 AM   Specimen: Anterior Nasal Swab  Result Value Ref Range Status   SARS Coronavirus 2 by RT PCR NEGATIVE NEGATIVE Final    Comment: (NOTE) SARS-CoV-2 target  nucleic acids are NOT DETECTED.  The SARS-CoV-2 RNA is generally detectable in upper respiratory specimens during the acute phase of infection. The lowest concentration of SARS-CoV-2 viral copies this assay can detect is 138 copies/mL. A negative result does not preclude SARS-Cov-2 infection and should not be used as the sole basis for treatment or other patient management decisions. A negative result may occur with  improper specimen collection/handling, submission of specimen other than nasopharyngeal swab, presence of viral mutation(s) within the areas targeted by this assay, and inadequate number of viral copies(<138 copies/mL). A negative result must be combined with clinical observations, patient history, and epidemiological information. The expected result is Negative.  Fact Sheet for Patients:  BloggerCourse.com  Fact Sheet for Healthcare Providers:  SeriousBroker.it  This test is no t yet approved or cleared by the [place] FDA and  has been authorized for detection and/or diagnosis of SARS-CoV-2 by FDA under an Emergency Use Authorization (EUA). This EUA will remain  in effect (meaning this test can be used) for the duration of the COVID-19 declaration under Section 564(b)(1) of the Act, 21 U.S.C.section 360bbb-3(b)(1), unless the authorization is terminated  or revoked sooner.       Influenza A by PCR NEGATIVE NEGATIVE Final   Influenza B by PCR NEGATIVE NEGATIVE Final    Comment: (NOTE) The Xpert Xpress SARS-CoV-2/FLU/RSV plus assay is intended as an aid in the diagnosis of influenza from Nasopharyngeal swab specimens and should not be used as a sole basis for treatment. Nasal washings and aspirates are unacceptable for Xpert Xpress SARS-CoV-2/FLU/RSV testing.  Fact Sheet for Patients: BloggerCourse.com  Fact Sheet for Healthcare  Providers: SeriousBroker.it  This test is not yet approved or cleared by the [place] FDA and has been authorized for detection and/or diagnosis of SARS-CoV-2 by FDA under an Emergency Use Authorization (EUA). This EUA will remain in effect (meaning this test can be used) for the duration of the COVID-19 declaration under Section 564(b)(1) of the Act, 21 U.S.C. section 360bbb-3(b)(1), unless the authorization is terminated or revoked.     Resp Syncytial Virus by PCR NEGATIVE NEGATIVE Final    Comment: (NOTE) Fact Sheet for Patients: BloggerCourse.com  Fact Sheet for Healthcare Providers: SeriousBroker.it  This test is not yet approved or cleared by the [place] FDA and has been authorized for detection and/or diagnosis of SARS-CoV-2 by FDA under an Emergency Use Authorization (EUA). This EUA will remain in effect (meaning this test can be used) for the duration of the COVID-19 declaration under Section 564(b)(1) of the Act, 21 U.S.C. section 360bbb-3(b)(1), unless the authorization is terminated or revoked.  Performed at [facility], [address]  [place], [place] [address]   Blood Culture (routine x 2)     Status: None (Preliminary result)   Collection Time: 04/11/23 10:01 AM   Specimen: Right Antecubital; Blood  Result Value Ref Range Status   Specimen Description RIGHT ANTECUBITAL  Final   Special Requests   Final    BOTTLES DRAWN AEROBIC AND ANAEROBIC Blood Culture adequate volume Performed at [facility], [address] [place], [place] [address]    Culture PENDING  Incomplete   Report Status PENDING  Incomplete  Blood Culture (routine x 2)     Status: None (Preliminary result)   Collection Time: 04/11/23 10:30 AM   Specimen: Left Antecubital; Blood  Result Value Ref Range Status   Specimen Description LEFT ANTECUBITAL  Final   Special Requests   Final    BOTTLES DRAWN AEROBIC  AND ANAEROBIC Blood Culture results may not be optimal due to an excessive volume of blood received in culture bottles Performed at [facility], [address] [place], [place] [address]    Culture PENDING  Incomplete   Report Status PENDING  Incomplete  MRSA Next Gen by PCR, Nasal     Status: None   Collection Time: 04/11/23  4:00 PM   Specimen: Nasal Mucosa; Nasal Swab  Result Value Ref Range Status   MRSA by PCR Next Gen NOT DETECTED NOT DETECTED Final    Comment: (NOTE) The GeneXpert MRSA Assay (FDA approved for NASAL specimens only), is one component of a comprehensive MRSA colonization surveillance program. It is not intended to diagnose MRSA infection nor to guide or monitor treatment for MRSA infections. Test performance is not FDA approved in patients less than 51 years old. Performed at [facility], [address] [place], [place] [address]      Labs: BNP (last 3 results) Recent Labs    02/09/23 1524 04/11/23 1001  BNP 95.0 69.0   Basic Metabolic Panel: Recent Labs  Lab 04/11/23 1001 04/11/23 1145 04/12/23 0453  NA 135  --  137  K 4.4  --  4.0  CL 98  --  101  CO2 27  --  26  GLUCOSE 124*  --  97  BUN 26*  --  19  CREATININE 1.46*  --  1.16  CALCIUM 8.7*  --  8.1*  MG  --  1.8  --   PHOS  --  2.1*  --    Liver Function Tests: Recent Labs  Lab 04/11/23 1001 04/12/23 0453  AST 24 24  ALT 20 23  ALKPHOS 57 48  BILITOT 1.0 0.5  PROT 7.6 5.9*  ALBUMIN 3.8 2.8*   Recent Labs  Lab 04/11/23 1001  LIPASE 26   No results for input(s): "AMMONIA" in the last 168 hours. CBC: Recent Labs  Lab 04/11/23 1001 04/12/23 0453  WBC 13.6* 10.7*  NEUTROABS 11.2*  --   HGB 13.2 11.1*  HCT 39.3 34.7*  MCV 91.0 94.6  PLT 243 212   Cardiac Enzymes: No results for input(s): "CKTOTAL", "CKMB", "CKMBINDEX", "TROPONINI" in the last 168 hours. BNP: Invalid input(s): "POCBNP" CBG: Recent Labs  Lab 04/12/23 0745  GLUCAP 97   D-Dimer Recent Labs     04/11/23 1001  DDIMER 1.37*   Hgb A1c No results for input(s): "HGBA1C" in the last 72 hours. Lipid Profile No results for input(s): "CHOL", "HDL", "LDLCALC", "TRIG", "CHOLHDL", "LDLDIRECT" in the last 72 hours. Thyroid function studies No results for input(s): "TSH", "T4TOTAL", "T3FREE", "THYROIDAB" in the last 72 hours.  Invalid input(s): "FREET3" Anemia work up No results for input(s): "VITAMINB12", "FOLATE", "FERRITIN", "TIBC", "IRON", "RETICCTPCT" in the last 72 hours. Urinalysis    Component Value Date/Time   COLORURINE YELLOW 04/11/2023 1600   APPEARANCEUR CLEAR 04/11/2023 1600   LABSPEC 1.021 04/11/2023 1600   PHURINE 6.0 04/11/2023 1600   GLUCOSEU NEGATIVE 04/11/2023 1600   HGBUR NEGATIVE 04/11/2023 1600   BILIRUBINUR NEGATIVE 04/11/2023 1600   KETONESUR NEGATIVE 04/11/2023 1600   PROTEINUR NEGATIVE 04/11/2023 1600   UROBILINOGEN 0.2 12/12/2014 1400   NITRITE NEGATIVE 04/11/2023 1600   LEUKOCYTESUR NEGATIVE 04/11/2023 1600   Sepsis Labs Recent Labs  Lab 04/11/23 1001 04/12/23 0453  WBC 13.6* 10.7*   Microbiology Recent Results (from the past 240 hour(s))  Resp panel by RT-PCR (RSV, Flu A&B, Covid) Anterior Nasal Swab     Status: None   Collection Time: 04/11/23  9:57 AM   Specimen: Anterior Nasal Swab  Result Value Ref Range Status   SARS Coronavirus 2 by RT PCR NEGATIVE NEGATIVE Final    Comment: (NOTE) SARS-CoV-2 target nucleic acids are NOT DETECTED.  The SARS-CoV-2 RNA is generally detectable in upper respiratory specimens during the acute phase of infection. The lowest concentration of SARS-CoV-2 viral copies this assay can detect is 138 copies/mL. A negative result does not preclude SARS-Cov-2 infection and should not be used as the sole basis for treatment or other patient management decisions. A negative result may occur with  improper specimen collection/handling, submission of specimen other than nasopharyngeal swab, presence of viral  mutation(s) within the areas targeted by this assay, and inadequate number of viral copies(<138 copies/mL). A negative result must be combined with clinical observations, patient history, and epidemiological information. The expected result is Negative.  Fact Sheet for Patients:  BloggerCourse.com  Fact Sheet for Healthcare Providers:  SeriousBroker.it  This test is no t yet approved or cleared by the [place] FDA and  has been authorized for detection and/or diagnosis of SARS-CoV-2 by FDA under an Emergency Use Authorization (EUA). This EUA will remain  in effect (meaning this test can be used) for the duration of the COVID-19 declaration under Section 564(b)(1) of the Act, 21 U.S.C.section 360bbb-3(b)(1), unless the authorization is terminated  or revoked sooner.       Influenza A by PCR NEGATIVE NEGATIVE Final   Influenza B by PCR NEGATIVE NEGATIVE Final    Comment: (NOTE) The Xpert Xpress SARS-CoV-2/FLU/RSV plus assay is intended as an aid in the diagnosis of influenza from Nasopharyngeal swab specimens and should not be used as a sole basis for treatment. Nasal washings and aspirates are unacceptable for Xpert Xpress SARS-CoV-2/FLU/RSV testing.  Fact Sheet for Patients: BloggerCourse.com  Fact Sheet for Healthcare Providers: SeriousBroker.it  This test is not yet approved or cleared by the [place] FDA and has been authorized for detection and/or diagnosis of SARS-CoV-2 by FDA under an Emergency Use Authorization (EUA). This EUA will remain in effect (meaning this test can be used) for the duration of the COVID-19 declaration under Section 564(b)(1) of the Act, 21 U.S.C. section 360bbb-3(b)(1), unless the authorization is terminated or revoked.     Resp Syncytial Virus by PCR NEGATIVE NEGATIVE Final    Comment: (NOTE) Fact Sheet for  Patients: BloggerCourse.com  Fact Sheet for Healthcare Providers: SeriousBroker.it  This test is not yet approved or cleared by the [place] FDA and has been authorized for detection and/or diagnosis of SARS-CoV-2 by FDA under an Emergency Use Authorization (EUA). This EUA will remain  in effect (meaning this test can be used) for the duration of the COVID-19 declaration under Section 564(b)(1) of the Act, 21 U.S.C. section 360bbb-3(b)(1), unless the authorization is terminated or revoked.  Performed at [facility], [address] [place], [place] [address]   Blood Culture (routine x 2)     Status: None (Preliminary result)   Collection Time: 04/11/23 10:01 AM   Specimen: Right Antecubital; Blood  Result Value Ref Range Status   Specimen Description RIGHT ANTECUBITAL  Final   Special Requests   Final    BOTTLES DRAWN AEROBIC AND ANAEROBIC Blood Culture adequate volume Performed at [facility], [address], [place], [place] [address]    Culture PENDING  Incomplete   Report Status PENDING  Incomplete  Blood Culture (routine x 2)     Status: None (Preliminary result)   Collection Time: 04/11/23 10:30 AM   Specimen: Left Antecubital; Blood  Result Value Ref Range Status   Specimen Description LEFT ANTECUBITAL  Final   Special Requests   Final    BOTTLES DRAWN AEROBIC AND ANAEROBIC Blood Culture results may not be optimal due to an excessive volume of blood received in culture bottles Performed at [facility], [address], [place], [place] [address]    Culture PENDING  Incomplete   Report Status PENDING  Incomplete  MRSA Next Gen by PCR, Nasal     Status: None   Collection Time: 04/11/23  4:00 PM   Specimen: Nasal Mucosa; Nasal Swab  Result Value Ref Range Status   MRSA by PCR Next Gen NOT DETECTED NOT DETECTED Final    Comment: (NOTE) The GeneXpert MRSA Assay (FDA approved for NASAL specimens only), is one component  of a comprehensive MRSA colonization surveillance program. It is not intended to diagnose MRSA infection nor to guide or monitor treatment for MRSA infections. Test performance is not FDA approved in patients less than 22 years old. Performed at [facility], [address] [place], [place] [address]      Time coordinating discharge: 35 minutes  SIGNED:  Marcelline Mates, MS4 Working with Dr. Maurilio Lovely

## 2023-04-12 NOTE — Care Management Obs Status (Signed)
MEDICARE OBSERVATION STATUS NOTIFICATION   Patient Details  Name: Cesar Gonzalez MRN: [id number] Date of Birth: [birth year]   Medicare Observation Status Notification Given:  Yes    Karn Cassis, LCSW 04/12/2023, 11:05 AM

## 2023-04-12 NOTE — TOC CM/SW Note (Signed)
Transition of Care [facility]) - Inpatient Brief Assessment   Patient Details  Name: Cesar Gonzalez MRN: [id number] Date of Birth: 03/05/1951  Transition of Care [facility]) CM/SW Contact:    Karn Cassis, LCSW Phone Number: 04/12/2023, 8:59 AM   Clinical Narrative: Pt admitted due to sepsis. Pt reports he lives with his wife and is independent with ADLs. PT evaluated pt and no follow up needed. No TOC needs reported at this time. Will follow.     Transition of Care Asessment: Insurance and Status: Insurance coverage has been reviewed Patient has primary care physician: Yes Home environment has been reviewed: Lives with wife. Prior level of function:: Independent. Prior/Current Home Services: No current home services Social Determinants of Health Reivew: SDOH reviewed no interventions necessary Readmission risk has been reviewed: Yes Transition of care needs: no transition of care needs at this time

## 2023-04-12 NOTE — Evaluation (Signed)
Occupational Therapy Evaluation Patient Details Name: Cesar Gonzalez MRN: [id number] DOB: 1951-07-20 Today's Date: 04/12/2023   History of Present Illness Cesar Gonzalez is a 72 y/o male with COPD, prior tobacco abuse, prior CAP, anxiety, HTN, and HLD who presented with worsening SOB and fevers.      Cesar Gonzalez notes that he was in his usual state of health until Sunday, 6/23, when he developed fevers up to high 103s. He also began feeling SOB and states that this worsened over the past two days. His fevers responded mildly to Tylenol over the past two days, coming down to 100-101. He also endorses a light headache over the same time period. He otherwise denies cough, sputum production, chest pain, changes in vision, abdominal pain, and changes in urination frequency or character. (per MD)   Clinical Impression   Pt agreeable to OT and PT co-evaluation. Pt appears to operating at or near baseline for for ADL's and ambulation. Pt was able to complete lower body dressing and ambulate in the hall. WFL B UE strength. Pt is not recommended for further acute OT services and will be discharged to care of nursing staff for remaining length of stay.       Recommendations for follow up therapy are one component of a multi-disciplinary discharge planning process, led by the attending physician.  Recommendations may be updated based on patient status, additional functional criteria and insurance authorization.   Assistance Recommended at Discharge None        Functional Status Assessment  Patient has not had a recent decline in their functional status  Equipment Recommendations  None recommended by OT           Precautions / Restrictions Precautions Precautions: None Restrictions Weight Bearing Restrictions: No      Mobility Bed Mobility Overal bed mobility: Independent                  Transfers Overall transfer level: Independent                        Balance  Overall balance assessment: Independent                                         ADL either performed or assessed with clinical judgement   ADL Overall ADL's : Independent                                             Vision Baseline Vision/History: 0 No visual deficits Ability to See in Adequate Light: 0 Adequate Patient Visual Report: No change from baseline Vision Assessment?: No apparent visual deficits                Pertinent Vitals/Pain Pain Assessment Pain Assessment: No/denies pain     Hand Dominance Right   Extremity/Trunk Assessment Upper Extremity Assessment Upper Extremity Assessment: Overall WFL for tasks assessed   Lower Extremity Assessment Lower Extremity Assessment: Defer to PT evaluation   Cervical / Trunk Assessment Cervical / Trunk Assessment: Normal   Communication Communication Communication: No difficulties   Cognition Arousal/Alertness: Awake/alert Behavior During Therapy: WFL for tasks assessed/performed Overall Cognitive Status: Within Functional Limits for tasks assessed  Home Living Family/patient expects to be discharged to:: Private residence Living Arrangements: Spouse/significant other Available Help at Discharge: Family;Available PRN/intermittently Type of Home: House Home Access: Stairs to enter [employer] of Steps: 3 Entrance Stairs-Rails: Can reach both;Left;Right Home Layout: Two level Alternate Level Stairs-Number of Steps: 15 Alternate Level Stairs-Rails: Left (going up) Bathroom Shower/Tub: Tub/shower unit;Walk-in shower   Bathroom Toilet: Standard Bathroom Accessibility: Yes How Accessible: Accessible via wheelchair;Accessible via walker Home Equipment: None          Prior Functioning/Environment Prior Level of Function : Independent/Modified Independent             Mobility  Comments: Pt works; 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 ADLs Comments: Independent                                Co-evaluation PT/OT/SLP Co-Evaluation/Treatment: Yes Reason for Co-Treatment: To address functional/ADL transfers   OT goals addressed during session: ADL's and self-care                       End of Session    Activity Tolerance: Patient tolerated treatment well Patient left: in chair;with call bell/phone within reach  OT Visit Diagnosis: Unsteadiness on feet (R26.81);Other abnormalities of gait and mobility (R26.89)                Time: 3295-1884 OT Time Calculation (min): 12 min Charges:  OT General Charges $OT Visit: 1 Visit OT Evaluation $OT Eval Low Complexity: 1 Low  Cesar Gonzalez OT, Cesar Gonzalez  Cesar Gonzalez 04/12/2023, 9:02 AM

## 2023-04-13 DIAGNOSIS — J449 Chronic obstructive pulmonary disease, unspecified: Secondary | ICD-10-CM | POA: Diagnosis not present

## 2023-04-14 LAB — CULTURE, BLOOD (ROUTINE X 2)
Culture: NO GROWTH
Special Requests: ADEQUATE

## 2023-04-15 LAB — CULTURE, BLOOD (ROUTINE X 2)

## 2023-04-16 LAB — CULTURE, BLOOD (ROUTINE X 2): Culture: NO GROWTH

## 2023-04-19 DIAGNOSIS — R6889 Other general symptoms and signs: Secondary | ICD-10-CM | POA: Diagnosis not present

## 2023-04-19 DIAGNOSIS — I10 Essential (primary) hypertension: Secondary | ICD-10-CM | POA: Diagnosis not present

## 2023-04-19 DIAGNOSIS — R5383 Other fatigue: Secondary | ICD-10-CM | POA: Diagnosis not present

## 2023-05-29 ENCOUNTER — Other Ambulatory Visit (HOSPITAL_COMMUNITY): Payer: Self-pay | Admitting: Family Medicine

## 2023-05-29 ENCOUNTER — Ambulatory Visit (HOSPITAL_COMMUNITY)
Admission: RE | Admit: 2023-05-29 | Discharge: 2023-05-29 | Disposition: A | Discharge status: Home / Self Care | Payer: Medicare Other | Source: Ambulatory Visit | Attending: Family Medicine | Admitting: Family Medicine

## 2023-05-29 DIAGNOSIS — J189 Pneumonia, unspecified organism: Secondary | ICD-10-CM

## 2023-05-29 DIAGNOSIS — R918 Other nonspecific abnormal finding of lung field: Secondary | ICD-10-CM | POA: Diagnosis not present

## 2023-07-10 DIAGNOSIS — J40 Bronchitis, not specified as acute or chronic: Secondary | ICD-10-CM | POA: Diagnosis not present

## 2023-07-10 DIAGNOSIS — J449 Chronic obstructive pulmonary disease, unspecified: Secondary | ICD-10-CM | POA: Diagnosis not present

## 2023-08-17 DIAGNOSIS — J449 Chronic obstructive pulmonary disease, unspecified: Secondary | ICD-10-CM | POA: Diagnosis not present

## 2023-08-17 DIAGNOSIS — E782 Mixed hyperlipidemia: Secondary | ICD-10-CM | POA: Diagnosis not present

## 2023-08-17 DIAGNOSIS — I10 Essential (primary) hypertension: Secondary | ICD-10-CM | POA: Diagnosis not present

## 2023-08-23 DIAGNOSIS — E7849 Other hyperlipidemia: Secondary | ICD-10-CM | POA: Diagnosis not present

## 2023-08-23 DIAGNOSIS — Z23 Encounter for immunization: Secondary | ICD-10-CM | POA: Diagnosis not present

## 2023-08-23 DIAGNOSIS — I10 Essential (primary) hypertension: Secondary | ICD-10-CM | POA: Diagnosis not present

## 2023-08-23 DIAGNOSIS — J449 Chronic obstructive pulmonary disease, unspecified: Secondary | ICD-10-CM | POA: Diagnosis not present

## 2023-08-23 DIAGNOSIS — E782 Mixed hyperlipidemia: Secondary | ICD-10-CM | POA: Diagnosis not present

## 2023-09-16 DIAGNOSIS — I10 Essential (primary) hypertension: Secondary | ICD-10-CM | POA: Diagnosis not present

## 2023-09-16 DIAGNOSIS — J449 Chronic obstructive pulmonary disease, unspecified: Secondary | ICD-10-CM | POA: Diagnosis not present

## 2023-11-20 DIAGNOSIS — J449 Chronic obstructive pulmonary disease, unspecified: Secondary | ICD-10-CM | POA: Diagnosis not present

## 2023-12-28 ENCOUNTER — Ambulatory Visit
Admission: EM | Admit: 2023-12-28 | Discharge: 2023-12-28 | Disposition: A | Discharge status: Home / Self Care | Attending: Nurse Practitioner | Admitting: Nurse Practitioner

## 2023-12-28 ENCOUNTER — Encounter (HOSPITAL_COMMUNITY): Payer: Self-pay

## 2023-12-28 ENCOUNTER — Emergency Department (HOSPITAL_COMMUNITY)
Admission: EM | Admit: 2023-12-28 | Discharge: 2023-12-28 | Discharge status: Against Medical Advice | Attending: Emergency Medicine | Admitting: Emergency Medicine

## 2023-12-28 ENCOUNTER — Other Ambulatory Visit: Payer: Self-pay

## 2023-12-28 DIAGNOSIS — R040 Epistaxis: Secondary | ICD-10-CM | POA: Insufficient documentation

## 2023-12-28 DIAGNOSIS — Z7982 Long term (current) use of aspirin: Secondary | ICD-10-CM | POA: Insufficient documentation

## 2023-12-28 DIAGNOSIS — Z5321 Procedure and treatment not carried out due to patient leaving prior to being seen by health care provider: Secondary | ICD-10-CM | POA: Insufficient documentation

## 2023-12-28 MED ORDER — OXYMETAZOLINE HCL 0.05 % NA SOLN
2.0000 | Freq: Once | NASAL | Status: AC
  Administered 2023-12-28: 2 via NASAL

## 2023-12-28 NOTE — ED Notes (Signed)
 Pt elected to leave d/t wait times.

## 2023-12-28 NOTE — Discharge Instructions (Addendum)
Please go to the emergency department for further evaluation.

## 2023-12-28 NOTE — ED Triage Notes (Signed)
 Pt reports his nose has been bleeding consistently x 30 min

## 2023-12-28 NOTE — ED Triage Notes (Signed)
 Pt arrived via POV from urgent care for on-going epistaxis. Pt reports urgent care tried nasal packing and afrin nasal spray w/o relief. Reports he needs a higher level of care than urgent care.

## 2023-12-28 NOTE — ED Triage Notes (Signed)
 Pt reports he blew his nose apprx 3 hrs PTA when his nose began to bleed. Pt denies taking blood thinners. Pt reports he only takes a baby aspirin daily with his daily supplements.

## 2023-12-28 NOTE — ED Provider Notes (Signed)
 [facility]    CSN: [id number] Arrival date & time: 12/28/23  1600      History   Chief Complaint No chief complaint on file.   HPI Cesar Gonzalez is a 73 y.o. male.   The history is provided by the patient.   Patient presents for epistaxis that started over the past hour.  Patient states his symptoms started out of "nowhere."  Patient denies excessive nose blowing, injury, trauma, use of blood thinners, lightheadedness, or dizziness.  Patient states that he currently is taking a numerous amount of supplements.  He was able to recall some of the supplements to include vitamin D, vitamin C, vitamin A, and vitamin K.  Patient states that he does take aspirin daily.  He also takes olmesartan for his blood pressure.  Past Medical History:  Diagnosis Date   Anxiety    COPD (chronic obstructive pulmonary disease) (HCC) 09/17/2018   Essential hypertension    Gout    Mixed hyperlipidemia    Nephritis    Pneumonia     Patient Active Problem List   Diagnosis Date Noted   AKI (acute kidney injury) (HCC) 04/11/2023   Claudication (HCC) 01/10/2023   Fatigue 01/10/2023   Post covid-19 condition, unspecified 01/10/2023   Elevated PSA 01/10/2023   Anxiety disorder 01/10/2023   Hyperlipidemia 01/10/2023   COPD (chronic obstructive pulmonary disease) (HCC) 09/17/2018   COPD exacerbation (HCC) 09/17/2018   Acute respiratory failure with hypoxia (HCC) 09/15/2018   Sepsis (HCC) 09/15/2018   Tobacco abuse 09/15/2018   Community acquired pneumonia 12/12/2014    Past Surgical History:  Procedure Laterality Date   TONSILLECTOMY         Home Medications    Prior to Admission medications   Medication Sig Start Date End Date Taking? Authorizing Provider  albuterol (PROVENTIL HFA;VENTOLIN HFA) 108 (90 Base) MCG/ACT inhaler Inhale 2 puffs into the lungs every 4 (four) hours as needed for wheezing or shortness of breath. 09/18/18   Johnson, Clanford L, MD  ALPRAZolam  Prudy Feeler) 1 MG tablet Take 1 mg by mouth 3 (three) times daily as needed for anxiety. 02/26/22   [provider]  Apple Cider Vinegar 500 MG TABS Take 1 tablet by mouth daily.    [provider]  aspirin EC 81 MG tablet Take 81 mg by mouth daily. Swallow whole.    [provider]  colchicine 0.6 MG tablet Take 0.6 mg by mouth daily as needed (gout flare up).    [provider]  Multiple Vitamins-Minerals (SUPER MULTI-VITAMIN PO) Take 1 Package by mouth daily.    [provider]  olmesartan (BENICAR) 40 MG tablet Take 40 mg by mouth daily.    [provider]  Omega-3 Fatty Acids (FISH OIL) 1000 MG CAPS Take 2,000 mg by mouth daily.    [provider]  predniSONE (DELTASONE) 10 MG tablet Take 10 mg by mouth daily as needed. Patient not taking: Reported on 04/12/2023 10/18/22   [provider]  rosuvastatin (CRESTOR) 5 MG tablet Take 5 mg by mouth daily.    [provider]  SYMBICORT 160-4.5 MCG/ACT inhaler Inhale 2 puffs into the lungs in the morning and at bedtime. 03/21/23   [provider]    Family History Family History  Problem Relation Age of Onset   Diabetes Mother    Osteoporosis Mother    COPD Father    Prostate cancer Father    Stroke Brother    Heart failure  Brother    Heart attack Maternal Grandfather    Brain cancer Paternal Grandmother    Stroke Paternal Grandfather     Social History Social History   Tobacco Use   Smoking status: Former    Current packs/day: 0.00    Average packs/day: 1 pack/day for 20.0 years (20.0 ttl pk-yrs)    Types: Cigarettes    Start date: 2002    Quit date: 2022    Years since quitting: 3.1   Smokeless tobacco: Never  Vaping Use   Vaping status: Never Used  Substance Use Topics   Alcohol use: Not Currently    Comment: Occasional   Drug use: No     Allergies   Patient has no known allergies.   Review of Systems Review of Systems Per  HPI  Physical Exam Triage Vital Signs ED Triage Vitals  Encounter Vitals Group     BP 12/28/23 1604 (!) 161/80     Systolic BP Percentile --      Diastolic BP Percentile --      Pulse Rate 12/28/23 1604 91     Resp 12/28/23 1604 16     Temp 12/28/23 1630 98.5 F (36.9 C)     Temp Source 12/28/23 1630 Oral     SpO2 12/28/23 1604 95 %     Weight --      Height --      Head Circumference --      Peak Flow --      Pain Score --      Pain Loc --      Pain Education --      Exclude from Growth Chart --    No data found.  Updated Vital Signs BP (!) 161/80 (BP Location: Right Arm)   Pulse 91   Temp 98.5 F (36.9 C) (Oral)   Resp 16   SpO2 95%   Visual Acuity Right Eye Distance:   Left Eye Distance:   Bilateral Distance:    Right Eye Near:   Left Eye Near:    Bilateral Near:     Physical Exam Vitals and nursing note reviewed.  Constitutional:      General: He is not in acute distress.    Appearance: Normal appearance.  HENT:     Head: Normocephalic.     Nose:     Right Nostril: No epistaxis.     Left Nostril: Epistaxis present.     Comments: Epistaxis noted to bilateral nostrils.  Afrin was applied to both nostrils, epistaxis has stopped in the right nostril, continued epistaxis from the right nostril. Eyes:     Extraocular Movements: Extraocular movements intact.     Pupils: Pupils are equal, round, and reactive to light.  Pulmonary:     Effort: Pulmonary effort is normal.  Skin:    General: Skin is warm and dry.  Neurological:     General: No focal deficit present.     Mental Status: He is alert and oriented to person, place, and time.  Psychiatric:        Mood and Affect: Mood normal.        Behavior: Behavior normal.     UC Treatments / Results  Labs (all labs ordered are listed, but only abnormal results are displayed) Labs Reviewed - No data to display  EKG   Radiology No results found.  Procedures Procedures (including critical care  time)  Medications Ordered in UC Medications - No data to display  Initial Impression /  Assessment and Plan / UC Course  I have reviewed the triage vital signs and the nursing notes.  Pertinent labs & imaging results that were available during my care of the patient were reviewed by me and considered in my medical decision making (see chart for details).  Patient presents for complaints of epistaxis that started approximately 30 minutes prior to his arrival.  Attempted to stop the nosebleed on several attempts with use of Afrin, Vaseline gauze, and pressure.  When removing Vaseline gauze, nosebleed would continue.  Blood was then, and was not clotting.  Patient has had persistent nosebleed for almost 2 hours, his vitals do remain stable.  Given the persistence of the nosebleed, and limited resources in this clinic, patient was advised to go to the emergency department for further evaluation.  Patient was in agreement with this plan of care and verbalized understanding.  Patient's vital signs were stable, he is ambulatory at discharge.  Patient discharged to the emergency department. Final Clinical Impressions(s) / UC Diagnoses   Final diagnoses:  None   Discharge Instructions   None    ED Prescriptions   None    PDMP not reviewed this encounter.   Abran Cantor, NP 12/28/23 1758

## 2023-12-28 NOTE — ED Notes (Signed)
 Applied Vaseline gauze and wet gauze pads into pt nose. Placed pt in layed back position  Pt denies dizziness

## 2023-12-28 NOTE — ED Notes (Signed)
 Administered afrin into both nostrils  and gave pt gauze pads to apply to nostrils

## 2023-12-28 NOTE — ED Notes (Signed)
 Patient is being discharged from the Urgent Care and sent to the Emergency Department via POV . Per provider, patient is in need of higher level of care due to severe nose bleeds x 2 hrs . Patient is aware and verbalizes understanding of plan of care.  Vitals:   12/28/23 1604 12/28/23 1630  BP: (!) 161/80   Pulse: 91   Resp: 16   Temp:  98.5 F (36.9 C)  SpO2: 95%

## 2024-02-07 DIAGNOSIS — J449 Chronic obstructive pulmonary disease, unspecified: Secondary | ICD-10-CM | POA: Diagnosis not present

## 2024-03-01 DIAGNOSIS — H40013 Open angle with borderline findings, low risk, bilateral: Secondary | ICD-10-CM | POA: Diagnosis not present

## 2024-03-01 DIAGNOSIS — H43393 Other vitreous opacities, bilateral: Secondary | ICD-10-CM | POA: Diagnosis not present

## 2024-03-01 DIAGNOSIS — H35362 Drusen (degenerative) of macula, left eye: Secondary | ICD-10-CM | POA: Diagnosis not present

## 2024-03-01 DIAGNOSIS — H35033 Hypertensive retinopathy, bilateral: Secondary | ICD-10-CM | POA: Diagnosis not present

## 2024-06-19 ENCOUNTER — Ambulatory Visit
Admission: EM | Admit: 2024-06-19 | Discharge: 2024-06-19 | Disposition: A | Discharge status: Home / Self Care | Attending: Nurse Practitioner | Admitting: Nurse Practitioner

## 2024-06-19 DIAGNOSIS — R000 Tachycardia, unspecified: Secondary | ICD-10-CM

## 2024-06-19 DIAGNOSIS — B349 Viral infection, unspecified: Secondary | ICD-10-CM | POA: Diagnosis not present

## 2024-06-19 LAB — POCT URINE DIPSTICK
Bilirubin, UA: NEGATIVE
Blood, UA: NEGATIVE
Glucose, UA: NEGATIVE mg/dL
Leukocytes, UA: NEGATIVE
Nitrite, UA: NEGATIVE
POC PROTEIN,UA: 100 — AB
Spec Grav, UA: >=1.03 — AB (ref 1.010–1.025)
Urobilinogen, UA: 0.2 U/dL
pH, UA: 5.5 (ref 5.0–8.0)

## 2024-06-19 NOTE — ED Triage Notes (Signed)
 Pt reports low grade fever, increased heart rate onset x 2 days ago. Has done home COVID/ Flu testing all was negative. Denies any congestion or cough. States he has felt otherwise fine.

## 2024-06-19 NOTE — ED Provider Notes (Signed)
 [facility]    CSN: [id number] Arrival date & time: 06/19/24  0808      History   Chief Complaint No chief complaint on file.   HPI Cesar Gonzalez is a 73 y.o. male.   The history is provided by the patient.   Patient presents for complaints of a low-grade temperature and elevated heart rate.  Tmax around 100.  States last temperature was yesterday, he does not have a temperature or fever today.  He denies chest pain, shortness of breath, difficulty breathing, chills, headache, ear pain, nasal congestion, runny nose, or cough.  Patient states that he was concerned about his heart rate being elevated, states I feel fine, I just want to get checked out.  Patient states that when he checked his heart rate at home, he had consumed caffeine, states that he drinks a yeti cup of coffee that last him all day.  Patient reports that he is drinking plenty of fluids and getting plenty of rest.  Denies nausea, vomiting, or obvious sick close contacts.  Per review of chart, patient with underlying history of anxiety.  Past Medical History:  Diagnosis Date   Anxiety    COPD (chronic obstructive pulmonary disease) (HCC) 09/17/2018   Essential hypertension    Gout    Mixed hyperlipidemia    Nephritis    Pneumonia     Patient Active Problem List   Diagnosis Date Noted   AKI (acute kidney injury) (HCC) 04/11/2023   Claudication (HCC) 01/10/2023   Fatigue 01/10/2023   Post covid-19 condition, unspecified 01/10/2023   Elevated PSA 01/10/2023   Anxiety disorder 01/10/2023   Hyperlipidemia 01/10/2023   COPD (chronic obstructive pulmonary disease) (HCC) 09/17/2018   COPD exacerbation (HCC) 09/17/2018   Acute respiratory failure with hypoxia (HCC) 09/15/2018   Sepsis (HCC) 09/15/2018   Tobacco abuse 09/15/2018   Community acquired pneumonia 12/12/2014    Past Surgical History:  Procedure Laterality Date   TONSILLECTOMY         Home Medications    Prior to Admission  medications   Medication Sig Start Date End Date Taking? Authorizing Provider  albuterol  (PROVENTIL  HFA;VENTOLIN  HFA) 108 (90 Base) MCG/ACT inhaler Inhale 2 puffs into the lungs every 4 (four) hours as needed for wheezing or shortness of breath. 09/18/18   Johnson, Clanford L, MD  ALPRAZolam  (XANAX ) 1 MG tablet Take 1 mg by mouth 3 (three) times daily as needed for anxiety. 02/26/22   [provider]  Apple Cider Vinegar 500 MG TABS Take 1 tablet by mouth daily.    [provider]  aspirin  EC 81 MG tablet Take 81 mg by mouth daily. Swallow whole.    [provider]  colchicine  0.6 MG tablet Take 0.6 mg by mouth daily as needed (gout flare up).    [provider]  Multiple Vitamins-Minerals (SUPER MULTI-VITAMIN PO) Take 1 Package by mouth daily.    [provider]  olmesartan (BENICAR) 40 MG tablet Take 40 mg by mouth daily.    [provider]  Omega-3 Fatty Acids (FISH OIL) 1000 MG CAPS Take 2,000 mg by mouth daily.    [provider]  predniSONE  (DELTASONE ) 10 MG tablet Take 10 mg by mouth daily as needed. Patient not taking: Reported on 04/12/2023 10/18/22   [provider]  rosuvastatin  (CRESTOR ) 5 MG tablet Take 5 mg by mouth daily.    [provider]  SYMBICORT 160-4.5 MCG/ACT inhaler Inhale 2 puffs into the lungs in  the morning and at bedtime. 03/21/23   [provider]    Family History Family History  Problem Relation Age of Onset   Diabetes Mother    Osteoporosis Mother    COPD Father    Prostate cancer Father    Stroke Brother    Heart failure Brother    Heart attack Maternal Grandfather    Brain cancer Paternal Grandmother    Stroke Paternal Grandfather     Social History Social History   Tobacco Use   Smoking status: Former    Current packs/day: 0.00    Average packs/day: 1 pack/day for 20.0 years (20.0 ttl pk-yrs)    Types: Cigarettes    Start date: 2002    Quit date: 2022    Years  since quitting: 3.6   Smokeless tobacco: Never  Vaping Use   Vaping status: Never Used  Substance Use Topics   Alcohol use: Not Currently    Comment: Occasional   Drug use: No     Allergies   Patient has no known allergies.   Review of Systems Review of Systems Per HPI  Physical Exam Triage Vital Signs ED Triage Vitals  Encounter Vitals Group     BP 06/19/24 0823 (!) 144/71     Girls Systolic BP Percentile --      Girls Diastolic BP Percentile --      Boys Systolic BP Percentile --      Boys Diastolic BP Percentile --      Pulse Rate 06/19/24 0823 (!) 109     Resp 06/19/24 0823 18     Temp 06/19/24 0823 97.9 F (36.6 C)     Temp Source 06/19/24 0823 Oral     SpO2 06/19/24 0823 93 %     Weight --      Height --      Head Circumference --      Peak Flow --      Pain Score 06/19/24 0827 0     Pain Loc --      Pain Education --      Exclude from Growth Chart --    No data found.  Updated Vital Signs BP (!) 144/71 (BP Location: Right Arm)   Pulse (!) 109   Temp 97.9 F (36.6 C) (Oral)   Resp 18   SpO2 93%   Visual Acuity Right Eye Distance:   Left Eye Distance:   Bilateral Distance:    Right Eye Near:   Left Eye Near:    Bilateral Near:     Physical Exam Vitals and nursing note reviewed.  Constitutional:      General: He is not in acute distress.    Appearance: Normal appearance.  HENT:     Head: Normocephalic.  Eyes:     Extraocular Movements: Extraocular movements intact.     Pupils: Pupils are equal, round, and reactive to light.  Cardiovascular:     Rate and Rhythm: Normal rate and regular rhythm.     Pulses: Normal pulses.     Heart sounds: Normal heart sounds.  Pulmonary:     Effort: Pulmonary effort is normal. No respiratory distress.     Breath sounds: Normal breath sounds. No stridor. No wheezing, rhonchi or rales.  Abdominal:     General: Bowel sounds are normal.     Palpations: Abdomen is soft.     Tenderness: There is no  abdominal tenderness.  Musculoskeletal:     Cervical back: Normal range of motion.  Skin:    General: Skin is warm and dry.  Neurological:     General: No focal deficit present.     Mental Status: He is alert and oriented to person, place, and time.  Psychiatric:        Mood and Affect: Mood normal.        Behavior: Behavior normal.      UC Treatments / Results  Labs (all labs ordered are listed, but only abnormal results are displayed) Labs Reviewed  POCT URINE DIPSTICK - Abnormal; Notable for the following components:      Result Value   Ketones, POC UA trace (5) (*)    Spec Grav, UA >=1.030 (*)    POC PROTEIN,UA =100 (*)    All other components within normal limits    EKG: Normal sinus rhythm, no ectopy, no STEMI.  Compared to EKGs dated 04/05/2024, 02/09/2023, and 10/12/2022.   Radiology No results found.  Procedures Procedures (including critical care time)  Medications Ordered in UC Medications - No data to display  Initial Impression / Assessment and Plan / UC Course  I have reviewed the triage vital signs and the nursing notes.  Pertinent labs & imaging results that were available during my care of the patient were reviewed by me and considered in my medical decision making (see chart for details).  Patient presents with a 2-day history of low-grade temperature and elevated heart rate.  EKG performed which shows normal sinus rhythm, no ectopy, no tachycardia.  Patient endorses that he does consume caffeine.  He also has underlying history of anxiety.  Urinalysis performed which was negative for infection.  On exam, the patient's lung sounds are clear throughout, he is well-appearing, and is in no acute distress.  Supportive care recommendations were provided and discussed with the patient to include increasing him fluids, recommend discontinuing caffeine until his symptoms improve, and allowing for plenty of rest.  Discussed indications with patient regarding  follow-up.  Patient was in agreement with this plan of care and verbalizes understanding.  All questions were answered.  Patient stable for discharge.  Final Clinical Impressions(s) / UC Diagnoses   Final diagnoses:  Tachycardia  Viral illness     Discharge Instructions      Your EKG and urinalysis were negative today. Increase fluids and allow for plenty of rest.  Recommend drinking at least 8-10 8 ounce glasses of water daily. As discussed, I do feel it is a good idea for you to discontinue use of caffeine until symptoms improve. Make sure you are getting plenty of rest. You may take over-the-counter Tylenol  as needed for pain, fever, or general discomfort. Go to the emergency department if you develop shortness of breath, difficulty breathing, or continued elevated heart rate with the symptoms. Recommend follow-up with your primary care physician if symptoms fail to improve. Follow-up as needed.     ED Prescriptions   None    PDMP not reviewed this encounter.   Gilmer Etta PARAS, NP 06/19/24 [phone]

## 2024-06-19 NOTE — Discharge Instructions (Signed)
 Your EKG and urinalysis were negative today. Increase fluids and allow for plenty of rest.  Recommend drinking at least 8-10 8 ounce glasses of water daily. As discussed, I do feel it is a good idea for you to discontinue use of caffeine until symptoms improve. Make sure you are getting plenty of rest. You may take over-the-counter Tylenol  as needed for pain, fever, or general discomfort. Go to the emergency department if you develop shortness of breath, difficulty breathing, or continued elevated heart rate with the symptoms. Recommend follow-up with your primary care physician if symptoms fail to improve. Follow-up as needed.

## 2024-08-06 ENCOUNTER — Ambulatory Visit
Admission: EM | Admit: 2024-08-06 | Discharge: 2024-08-06 | Disposition: A | Discharge status: Home / Self Care | Attending: Nurse Practitioner | Admitting: Nurse Practitioner

## 2024-08-06 ENCOUNTER — Encounter: Payer: Self-pay | Admitting: Emergency Medicine

## 2024-08-06 DIAGNOSIS — J069 Acute upper respiratory infection, unspecified: Secondary | ICD-10-CM

## 2024-08-06 DIAGNOSIS — J441 Chronic obstructive pulmonary disease with (acute) exacerbation: Secondary | ICD-10-CM

## 2024-08-06 MED ORDER — FLUTICASONE PROPIONATE 50 MCG/ACT NA SUSP: 2.0000 | Freq: Every day | NASAL | 0 refills | Status: DC

## 2024-08-06 MED ORDER — PREDNISONE 20 MG PO TABS: 40.0000 mg | ORAL_TABLET | Freq: Every day | ORAL | 0 refills | Status: AC

## 2024-08-06 MED ORDER — AMOXICILLIN-POT CLAVULANATE 875-125 MG PO TABS: 1.0000 | ORAL_TABLET | Freq: Two times a day (BID) | ORAL | 0 refills | Status: DC

## 2024-08-06 NOTE — ED Provider Notes (Signed)
 [facility]    CSN: [id number] Arrival date & time: 08/06/24  9177      History   Chief Complaint No chief complaint on file.   HPI Cesar Gonzalez is a 73 y.o. male.   The history is provided by the patient.   Patient presents for complaints of chest congestion, cough, shortness of breath, and nasal congestion.  Patient states symptoms started 1 day ago.  He denies fever, chills, headache, ear pain, wheezing, difficulty breathing, chest pain, abdominal pain, nausea, vomiting, diarrhea, or rash.  Patient reports underlying history of COPD.  He also informs that he has had a history of pneumonia.  So far, states he has been taking over-the-counter Mucinex  for his symptoms.  Also informs that he took a home COVID/flu test which was negative.  He denies any obvious close sick contacts.  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 Authorizing Provider  albuterol  (PROVENTIL  HFA;VENTOLIN  HFA) 108 (90 Base) MCG/ACT inhaler Inhale 2 puffs into the lungs every 4 (four) hours as needed for wheezing or shortness of  breath. 09/18/18   Johnson, Clanford L, MD  ALPRAZolam  (XANAX ) 1 MG tablet Take 1 mg by mouth 3 (three) times daily as needed for anxiety. 02/26/22   [provider]  Apple Cider Vinegar 500 MG TABS Take 1 tablet by mouth daily.    [provider]  colchicine  0.6 MG tablet Take 0.6 mg by mouth daily as needed (gout flare up).    [provider]  Multiple Vitamins-Minerals (SUPER MULTI-VITAMIN PO) Take 1 Package by mouth daily.    [provider]  olmesartan (BENICAR) 40 MG tablet Take 40 mg by mouth daily.    [provider]  Omega-3 Fatty Acids (FISH OIL) 1000 MG CAPS Take 2,000 mg by mouth daily.    [provider]  predniSONE  (DELTASONE ) 10 MG tablet Take 10 mg by mouth daily as needed. Patient not taking: Reported on 04/12/2023 10/18/22   [provider]  rosuvastatin  (CRESTOR ) 5 MG tablet Take 5 mg by mouth daily.    [provider]  SYMBICORT 160-4.5 MCG/ACT inhaler Inhale 2 puffs into the lungs in the morning and at bedtime. 03/21/23   [provider]    Family History Family History  Problem Relation Age of Onset   Diabetes Mother    Osteoporosis Mother    COPD Father    Prostate cancer Father    Stroke Brother    Heart failure Brother    Heart attack Maternal Grandfather    Brain cancer  Paternal Grandmother    Stroke Paternal Grandfather     Social History Social History   Tobacco Use   Smoking status: Former    Current packs/day: 0.00    Average packs/day: 1 pack/day for 20.0 years (20.0 ttl pk-yrs)    Types: Cigarettes    Start date: 2002    Quit date: 2022    Years since quitting: 3.8   Smokeless tobacco: Never  Vaping Use   Vaping status: Never Used  Substance Use Topics   Alcohol use: Not Currently    Comment: Occasional   Drug use: No     Allergies   Patient has no known allergies.   Review of Systems Review of Systems Per HPI  Physical Exam Triage Vital Signs ED Triage  Vitals  Encounter Vitals Group     BP 08/06/24 0830 114/61     Girls Systolic BP Percentile --      Girls Diastolic BP Percentile --      Boys Systolic BP Percentile --      Boys Diastolic BP Percentile --      Pulse Rate 08/06/24 0830 (!) 108     Resp 08/06/24 0830 18     Temp 08/06/24 0830 97.9 F (36.6 C)     Temp Source 08/06/24 0830 Oral     SpO2 08/06/24 0830 91 %     Weight --      Height --      Head Circumference --      Peak Flow --      Pain Score 08/06/24 0831 0     Pain Loc --      Pain Education --      Exclude from Growth Chart --    No data found.  Updated Vital Signs BP 114/61 (BP Location: Right Arm)   Pulse (!) 108   Temp 97.9 F (36.6 C) (Oral)   Resp 18   SpO2 91%   Visual Acuity Right Eye Distance:   Left Eye Distance:   Bilateral Distance:    Right Eye Near:   Left Eye Near:    Bilateral Near:     Physical Exam Vitals and nursing note reviewed.  Constitutional:      General: He is not in acute distress.    Appearance: Normal appearance.  HENT:     Head: Normocephalic.     Right Ear: Tympanic membrane, ear canal and external ear normal.     Left Ear: Tympanic membrane, ear canal and external ear normal.     Nose: Congestion present.     Mouth/Throat:     Mouth: Mucous membranes are moist.     Pharynx: No posterior oropharyngeal erythema.  Eyes:     Extraocular Movements: Extraocular movements intact.     Pupils: Pupils are equal, round, and reactive to light.  Cardiovascular:     Rate and Rhythm: Normal rate and regular rhythm.     Pulses: Normal pulses.     Heart sounds: Normal heart sounds.  Pulmonary:     Effort: Pulmonary effort is normal. No respiratory distress.     Breath sounds: Normal breath sounds. No stridor. No wheezing, rhonchi or rales.  Abdominal:     General: Bowel sounds are normal.     Palpations: Abdomen is soft.  Musculoskeletal:     Cervical back: Normal range of motion.  Skin:    General: Skin is warm  and dry.  Neurological:     General: No focal deficit  present.     Mental Status: He is alert and oriented to person, place, and time.  Psychiatric:        Mood and Affect: Mood normal.        Behavior: Behavior normal.      UC Treatments / Results  Labs (all labs ordered are listed, but only abnormal results are displayed) Labs Reviewed - No data to display  EKG   Radiology No results found.  Procedures Procedures (including critical care time)  Medications Ordered in UC Medications - No data to display  Initial Impression / Assessment and Plan / UC Course  I have reviewed the triage vital signs and the nursing notes.  Pertinent labs & imaging results that were available during my care of the patient were reviewed by me and considered in my medical decision making (see chart for details).  On exam, lung sounds are clear throughout, room air sats are at 91%.  The patient is well-appearing, he is in no acute distress, he is mildly tachycardic, but no other concerns at this time.  Imaging is not indicated at this time.  Patient with underlying history of COPD.  Suspect patient has a viral URI, but will cover for COPD exacerbation.  Will treat with Augmentin  875/125 mg, prednisone  40 mg, and fluticasone 50 mcg nasal spray.  Patient will continue Mucinex  he is currently taking.  Supportive care recommendations were provided and discussed with the patient to include fluids, rest, over-the-counter Tylenol , and use of a humidifier during sleep.  Patient advised to continue his current inhalers.  Patient was also given follow-up precautions.  Patient was in agreement with this plan of care and verbalizes understanding.  All questions were answered.  Patient stable for discharge.  Final Clinical Impressions(s) / UC Diagnoses   Final diagnoses:  None   Discharge Instructions   None    ED Prescriptions   None    PDMP not reviewed this encounter.   Gilmer Etta PARAS,  NP 08/06/24 [phone]

## 2024-08-06 NOTE — ED Triage Notes (Signed)
 Productive cough, chest and nasal congestion since Monday.  Has been taking mucinex .  Home covid/flu test was negative yesterday.

## 2024-08-06 NOTE — Discharge Instructions (Addendum)
 Take medication as prescribed.  You may continue your Mucinex  for your cough. Increase fluids and allow for plenty of rest. Continue your current medications. You may take over-the-counter Tylenol  as needed for pain, fever, or general discomfort. Recommend normal saline nasal spray throughout the day for nasal congestion and runny nose. For your cough, you may find it helpful to use a humidifier in your bedroom at nighttime during sleep and to sleep elevated on pillows while symptoms persist. If your symptoms fail to improve after completing this treatment, please follow-up with your primary care physician for further evaluation. Follow-up as needed.

## 2024-08-18 ENCOUNTER — Inpatient Hospital Stay (HOSPITAL_COMMUNITY)
Admission: EM | Admit: 2024-08-18 | Discharge: 2024-08-20 | DRG: 871 | Disposition: A | Discharge status: Home / Self Care | Attending: Internal Medicine | Admitting: Internal Medicine

## 2024-08-18 ENCOUNTER — Other Ambulatory Visit: Payer: Self-pay

## 2024-08-18 ENCOUNTER — Encounter: Payer: Self-pay | Admitting: Emergency Medicine

## 2024-08-18 ENCOUNTER — Ambulatory Visit: Admission: EM | Admit: 2024-08-18 | Discharge: 2024-08-18 | Disposition: A | Discharge status: Home / Self Care

## 2024-08-18 ENCOUNTER — Encounter (HOSPITAL_COMMUNITY): Payer: Self-pay

## 2024-08-18 ENCOUNTER — Inpatient Hospital Stay (HOSPITAL_COMMUNITY)

## 2024-08-18 ENCOUNTER — Emergency Department (HOSPITAL_COMMUNITY)

## 2024-08-18 DIAGNOSIS — N179 Acute kidney failure, unspecified: Secondary | ICD-10-CM | POA: Diagnosis present

## 2024-08-18 DIAGNOSIS — J189 Pneumonia, unspecified organism: Secondary | ICD-10-CM | POA: Diagnosis not present

## 2024-08-18 DIAGNOSIS — Z825 Family history of asthma and other chronic lower respiratory diseases: Secondary | ICD-10-CM

## 2024-08-18 DIAGNOSIS — E860 Dehydration: Secondary | ICD-10-CM

## 2024-08-18 DIAGNOSIS — E872 Acidosis, unspecified: Secondary | ICD-10-CM | POA: Diagnosis present

## 2024-08-18 DIAGNOSIS — J441 Chronic obstructive pulmonary disease with (acute) exacerbation: Secondary | ICD-10-CM

## 2024-08-18 DIAGNOSIS — Z1152 Encounter for screening for COVID-19: Secondary | ICD-10-CM | POA: Diagnosis not present

## 2024-08-18 DIAGNOSIS — J449 Chronic obstructive pulmonary disease, unspecified: Secondary | ICD-10-CM | POA: Diagnosis present

## 2024-08-18 DIAGNOSIS — Z23 Encounter for immunization: Secondary | ICD-10-CM | POA: Diagnosis present

## 2024-08-18 DIAGNOSIS — J440 Chronic obstructive pulmonary disease with acute lower respiratory infection: Secondary | ICD-10-CM | POA: Diagnosis present

## 2024-08-18 DIAGNOSIS — R000 Tachycardia, unspecified: Secondary | ICD-10-CM | POA: Diagnosis not present

## 2024-08-18 DIAGNOSIS — J181 Lobar pneumonia, unspecified organism: Secondary | ICD-10-CM | POA: Diagnosis present

## 2024-08-18 DIAGNOSIS — I959 Hypotension, unspecified: Secondary | ICD-10-CM

## 2024-08-18 DIAGNOSIS — Z8249 Family history of ischemic heart disease and other diseases of the circulatory system: Secondary | ICD-10-CM | POA: Diagnosis not present

## 2024-08-18 DIAGNOSIS — Z833 Family history of diabetes mellitus: Secondary | ICD-10-CM | POA: Diagnosis not present

## 2024-08-18 DIAGNOSIS — R0609 Other forms of dyspnea: Secondary | ICD-10-CM | POA: Diagnosis not present

## 2024-08-18 DIAGNOSIS — E869 Volume depletion, unspecified: Secondary | ICD-10-CM | POA: Diagnosis present

## 2024-08-18 DIAGNOSIS — F419 Anxiety disorder, unspecified: Secondary | ICD-10-CM | POA: Diagnosis present

## 2024-08-18 DIAGNOSIS — E782 Mixed hyperlipidemia: Secondary | ICD-10-CM | POA: Diagnosis present

## 2024-08-18 DIAGNOSIS — Z7951 Long term (current) use of inhaled steroids: Secondary | ICD-10-CM

## 2024-08-18 DIAGNOSIS — Z79899 Other long term (current) drug therapy: Secondary | ICD-10-CM | POA: Diagnosis not present

## 2024-08-18 DIAGNOSIS — R739 Hyperglycemia, unspecified: Secondary | ICD-10-CM | POA: Diagnosis present

## 2024-08-18 DIAGNOSIS — I129 Hypertensive chronic kidney disease with stage 1 through stage 4 chronic kidney disease, or unspecified chronic kidney disease: Secondary | ICD-10-CM | POA: Diagnosis present

## 2024-08-18 DIAGNOSIS — R6520 Severe sepsis without septic shock: Secondary | ICD-10-CM | POA: Diagnosis present

## 2024-08-18 DIAGNOSIS — M109 Gout, unspecified: Secondary | ICD-10-CM | POA: Diagnosis present

## 2024-08-18 DIAGNOSIS — R0902 Hypoxemia: Secondary | ICD-10-CM

## 2024-08-18 DIAGNOSIS — Z87891 Personal history of nicotine dependence: Secondary | ICD-10-CM

## 2024-08-18 DIAGNOSIS — A419 Sepsis, unspecified organism: Principal | ICD-10-CM | POA: Diagnosis present

## 2024-08-18 DIAGNOSIS — N1831 Chronic kidney disease, stage 3a: Secondary | ICD-10-CM | POA: Diagnosis present

## 2024-08-18 LAB — CBC WITH DIFFERENTIAL/PLATELET
Abs Immature Granulocytes: 0.25 K/uL — ABNORMAL HIGH (ref 0.00–0.07)
Basophils Absolute: 0.1 K/uL (ref 0.0–0.1)
Basophils Relative: 0 %
Eosinophils Absolute: 0.4 K/uL (ref 0.0–0.5)
Eosinophils Relative: 1 %
HCT: 36 % — ABNORMAL LOW (ref 39.0–52.0)
Hemoglobin: 12 g/dL — ABNORMAL LOW (ref 13.0–17.0)
Immature Granulocytes: 1 %
Lymphocytes Relative: 6 %
Lymphs Abs: 1.5 K/uL (ref 0.7–4.0)
MCH: 31.7 pg (ref 26.0–34.0)
MCHC: 33.3 g/dL (ref 30.0–36.0)
MCV: 95.2 fL (ref 80.0–100.0)
Monocytes Absolute: 1.9 K/uL — ABNORMAL HIGH (ref 0.1–1.0)
Monocytes Relative: 7 %
Neutro Abs: 22.1 K/uL — ABNORMAL HIGH (ref 1.7–7.7)
Neutrophils Relative %: 85 %
Platelets: 239 K/uL (ref 150–400)
RBC: 3.78 MIL/uL — ABNORMAL LOW (ref 4.22–5.81)
RDW: 11.8 % (ref 11.5–15.5)
Smear Review: NORMAL
WBC: 26.2 K/uL — ABNORMAL HIGH (ref 4.0–10.5)
nRBC: 0 % (ref 0.0–0.2)

## 2024-08-18 LAB — COMPREHENSIVE METABOLIC PANEL WITH GFR
ALT: 26 U/L (ref 0–44)
AST: 27 U/L (ref 15–41)
Albumin: 4.4 g/dL (ref 3.5–5.0)
Alkaline Phosphatase: 87 U/L (ref 38–126)
Anion gap: 12 (ref 5–15)
BUN: 32 mg/dL — ABNORMAL HIGH (ref 8–23)
CO2: 28 mmol/L (ref 22–32)
Calcium: 9.4 mg/dL (ref 8.9–10.3)
Chloride: 97 mmol/L — ABNORMAL LOW (ref 98–111)
Creatinine, Ser: 2.09 mg/dL — ABNORMAL HIGH (ref 0.61–1.24)
GFR, Estimated: 33 mL/min — ABNORMAL LOW (ref >60)
Glucose, Bld: 105 mg/dL — ABNORMAL HIGH (ref 70–99)
Potassium: 4 mmol/L (ref 3.5–5.1)
Sodium: 137 mmol/L (ref 135–145)
Total Bilirubin: 0.4 mg/dL (ref 0.0–1.2)
Total Protein: 7.5 g/dL (ref 6.5–8.1)

## 2024-08-18 LAB — TYPE AND SCREEN
ABO/RH(D): O POS
Antibody Screen: NEGATIVE

## 2024-08-18 LAB — RESP PANEL BY RT-PCR (RSV, FLU A&B, COVID)  RVPGX2
Influenza A by PCR: NEGATIVE
Influenza B by PCR: NEGATIVE
Resp Syncytial Virus by PCR: NEGATIVE
SARS Coronavirus 2 by RT PCR: NEGATIVE

## 2024-08-18 LAB — PROTIME-INR
INR: 1.2 (ref 0.8–1.2)
Prothrombin Time: 15.9 s — ABNORMAL HIGH (ref 11.4–15.2)

## 2024-08-18 LAB — URINALYSIS, ROUTINE W REFLEX MICROSCOPIC
Bilirubin Urine: NEGATIVE
Glucose, UA: NEGATIVE mg/dL
Hgb urine dipstick: NEGATIVE
Ketones, ur: NEGATIVE mg/dL
Leukocytes,Ua: NEGATIVE
Nitrite: NEGATIVE
Protein, ur: NEGATIVE mg/dL
Specific Gravity, Urine: 1.01 (ref 1.005–1.030)
pH: 5 (ref 5.0–8.0)

## 2024-08-18 LAB — LACTIC ACID, PLASMA
Lactic Acid, Venous: 2.6 mmol/L (ref 0.5–1.9)
Lactic Acid, Venous: 1.3 mmol/L (ref 0.5–1.9)

## 2024-08-18 LAB — APTT: aPTT: 35 s (ref 24–36)

## 2024-08-18 MED ORDER — IPRATROPIUM-ALBUTEROL 0.5-2.5 (3) MG/3ML IN SOLN: 3.0000 mL | RESPIRATORY_TRACT | Status: DC | PRN

## 2024-08-18 MED ORDER — SODIUM CHLORIDE 0.9 % IV SOLN
2.0000 g | Freq: Once | INTRAVENOUS | Status: AC
  Administered 2024-08-18: 2 g via INTRAVENOUS
  Filled 2024-08-18: qty 20

## 2024-08-18 MED ORDER — ACETAMINOPHEN 325 MG PO TABS: 650.0000 mg | ORAL_TABLET | Freq: Four times a day (QID) | ORAL | Status: DC | PRN

## 2024-08-18 MED ORDER — ACETAMINOPHEN 650 MG RE SUPP: 650.0000 mg | Freq: Four times a day (QID) | RECTAL | Status: DC | PRN

## 2024-08-18 MED ORDER — FLUTICASONE FUROATE-VILANTEROL 200-25 MCG/ACT IN AEPB
1.0000 | INHALATION_SPRAY | Freq: Every day | RESPIRATORY_TRACT | Status: DC
  Administered 2024-08-19 – 2024-08-20 (×2): 1 via RESPIRATORY_TRACT
  Filled 2024-08-18: qty 28

## 2024-08-18 MED ORDER — LACTATED RINGERS IV BOLUS (SEPSIS)
1000.0000 mL | Freq: Once | INTRAVENOUS | Status: AC
  Administered 2024-08-18: 1000 mL via INTRAVENOUS

## 2024-08-18 MED ORDER — SODIUM CHLORIDE 0.9 % IV SOLN
500.0000 mg | Freq: Once | INTRAVENOUS | Status: AC
  Administered 2024-08-18: 500 mg via INTRAVENOUS
  Filled 2024-08-18: qty 5

## 2024-08-18 MED ORDER — HEPARIN SODIUM (PORCINE) 5000 UNIT/ML IJ SOLN
5000.0000 [IU] | Freq: Three times a day (TID) | INTRAMUSCULAR | Status: DC
  Administered 2024-08-18 – 2024-08-20 (×5): 5000 [IU] via SUBCUTANEOUS
  Filled 2024-08-18 (×5): qty 1

## 2024-08-18 MED ORDER — LACTATED RINGERS IV BOLUS (SEPSIS)
1000.0000 mL | Freq: Once | INTRAVENOUS | Status: AC
Start: 2024-08-18 — End: 2024-08-18
  Administered 2024-08-18: 1000 mL via INTRAVENOUS

## 2024-08-18 MED ORDER — ONDANSETRON HCL 4 MG/2ML IJ SOLN: 4.0000 mg | Freq: Four times a day (QID) | INTRAMUSCULAR | Status: DC | PRN

## 2024-08-18 MED ORDER — SODIUM CHLORIDE 0.9 % IV SOLN
2.0000 g | INTRAVENOUS | Status: DC
  Administered 2024-08-19: 2 g via INTRAVENOUS
  Filled 2024-08-18: qty 20

## 2024-08-18 MED ORDER — POLYETHYLENE GLYCOL 3350 17 G PO PACK: 17.0000 g | PACK | Freq: Every day | ORAL | Status: DC | PRN

## 2024-08-18 MED ORDER — IPRATROPIUM-ALBUTEROL 0.5-2.5 (3) MG/3ML IN SOLN
3.0000 mL | Freq: Once | RESPIRATORY_TRACT | Status: AC
  Administered 2024-08-18: 3 mL via RESPIRATORY_TRACT
  Filled 2024-08-18: qty 3

## 2024-08-18 MED ORDER — SODIUM CHLORIDE 0.9 % IV SOLN
500.0000 mg | INTRAVENOUS | Status: DC
  Administered 2024-08-19: 500 mg via INTRAVENOUS
  Filled 2024-08-18: qty 5

## 2024-08-18 MED ORDER — ONDANSETRON HCL 4 MG PO TABS: 4.0000 mg | ORAL_TABLET | Freq: Four times a day (QID) | ORAL | Status: DC | PRN

## 2024-08-18 MED ORDER — METHYLPREDNISOLONE SODIUM SUCC 125 MG IJ SOLR
125.0000 mg | Freq: Once | INTRAMUSCULAR | Status: AC
  Administered 2024-08-18: 125 mg via INTRAVENOUS
  Filled 2024-08-18: qty 2

## 2024-08-18 MED ORDER — GUAIFENESIN-DM 100-10 MG/5ML PO SYRP
15.0000 mL | ORAL_SOLUTION | Freq: Three times a day (TID) | ORAL | Status: AC
  Administered 2024-08-18 – 2024-08-19 (×3): 15 mL via ORAL
  Filled 2024-08-18 (×3): qty 15

## 2024-08-18 MED ORDER — INFLUENZA VAC SPLIT HIGH-DOSE 0.5 ML IM SUSY
0.5000 mL | PREFILLED_SYRINGE | INTRAMUSCULAR | Status: AC
  Administered 2024-08-19: 0.5 mL via INTRAMUSCULAR
  Filled 2024-08-18: qty 0.5

## 2024-08-18 MED ORDER — ROSUVASTATIN CALCIUM 10 MG PO TABS
5.0000 mg | ORAL_TABLET | Freq: Every day | ORAL | Status: DC
  Administered 2024-08-18 – 2024-08-19 (×2): 5 mg via ORAL
  Filled 2024-08-18 (×2): qty 1

## 2024-08-18 MED ORDER — LACTATED RINGERS IV SOLN: INTRAVENOUS | Status: DC

## 2024-08-18 NOTE — ED Triage Notes (Signed)
 Pt arrives POV, transfer from UC. 2 weeks ago pt was rx ammox and prednisone  for a URI and was feeling better. Friday, he began to feel worse again. Fever, congestion, low sats on home pulseox. UC sent here for c/f sepsis based on VS.

## 2024-08-18 NOTE — Sepsis Progress Note (Signed)
 Provider and staff RN notified of patient calculated fluid resuscitation.

## 2024-08-18 NOTE — Sepsis Progress Note (Signed)
 eLink Sepsis tracking per protocol.

## 2024-08-18 NOTE — ED Triage Notes (Addendum)
 Pt reports was seen for similar and reports medication helped and seem to get better but reports since Friday has had intermittent fever, nasal congestion, fatigue, generalized weakness. mild dyspnea with exertion noted in triage.

## 2024-08-18 NOTE — Discharge Instructions (Signed)
 Go directly to the emergency department for further evaluation.  We do not recommend that you drive yourself for safety reasons

## 2024-08-18 NOTE — ED Notes (Signed)
 Pt is allowed to drink.

## 2024-08-18 NOTE — ED Notes (Addendum)
 Patient is being discharged from the Urgent Care and sent to the Emergency Department via POV. Per PA, patient is in need of higher level of care due to abnormal vital signs/possible sepsis. Patient is aware and verbalizes understanding of plan of care.  Vitals:   08/18/24 1214  BP: (!) 94/58  Pulse: 91  Resp: 18  Temp: 98.5 F (36.9 C)  SpO2: (!) 89%   Pt declined EMS.

## 2024-08-18 NOTE — ED Provider Notes (Signed)
  EMERGENCY DEPARTMENT AT [facility] Provider Note   CSN: [id number] Arrival date & time: 08/18/24  1244     Patient presents with: Illness   Cesar Gonzalez is a 73 y.o. male.   Patient is a 73 year old male who presents to the emergency department with a chief complaint of generalized malaise and fatigue, shortness of breath, fever and congestion.  He notes that he was diagnosed with a respiratory faction approximately 1 week ago and treated with amoxicillin  and prednisone .  He notes his symptoms did initially improve but notes that approximately 2 days ago he developed recurrence of fever, generalized weakness and shortness of breath.  He denies any associated dizziness, lightheadedness or syncope.  He has had no abnormal headaches, pain to neck or back.  He has had no associated nausea, vomiting, diarrhea.   Illness Associated symptoms: fatigue and fever        Prior to Admission medications   Medication Sig Start Date End Date Taking? Authorizing Provider  albuterol  (PROVENTIL  HFA;VENTOLIN  HFA) 108 (90 Base) MCG/ACT inhaler Inhale 2 puffs into the lungs every 4 (four) hours as needed for wheezing or shortness of breath. 09/18/18   Johnson, Clanford L, MD  ALPRAZolam  (XANAX ) 1 MG tablet Take 1 mg by mouth 3 (three) times daily as needed for anxiety. 02/26/22   [provider]  amLODipine (NORVASC) 5 MG tablet Take 5 mg by mouth daily. 08/13/24   [provider]  amoxicillin -clavulanate (AUGMENTIN ) 875-125 MG tablet Take 1 tablet by mouth every 12 (twelve) hours. 08/06/24   Leath-Warren, Etta PARAS, NP  Apple Cider Vinegar 500 MG TABS Take 1 tablet by mouth daily.    [provider]  clobetasol cream (TEMOVATE) 0.05 % Apply 1 Application topically 2 (two) times daily. Gently cover the entire affected area of upper and lower extremities. Do not apply to eyes 08/13/24   [provider]  colchicine  0.6 MG tablet Take 0.6 mg by  mouth daily as needed (gout flare up).    [provider]  fluticasone (FLONASE) 50 MCG/ACT nasal spray Place 2 sprays into both nostrils daily. 08/06/24   Leath-Warren, Etta PARAS, NP  hydrochlorothiazide (HYDRODIURIL) 12.5 MG tablet Take 12.5 mg by mouth every morning. 05/13/24   [provider]  Multiple Vitamins-Minerals (SUPER MULTI-VITAMIN PO) Take 1 Package by mouth daily.    [provider]  olmesartan (BENICAR) 40 MG tablet Take 40 mg by mouth daily.    [provider]  Omega-3 Fatty Acids (FISH OIL) 1000 MG CAPS Take 2,000 mg by mouth daily.    [provider]  rosuvastatin  (CRESTOR ) 5 MG tablet Take 5 mg by mouth daily.    [provider]  SYMBICORT 160-4.5 MCG/ACT inhaler Inhale 2 puffs into the lungs in the morning and at bedtime. 03/21/23   [provider]    Allergies: Patient has no known allergies.    Review of Systems  Constitutional:  Positive for fatigue and fever.  All other systems reviewed and are negative.   Updated Vital Signs BP (!) 97/56 (BP Location: Right Arm)   Pulse 92   Temp 98.2 F (36.8 C) (Oral)   Resp (!) 23   Ht 6' 2 (1.88 m)   Wt 88.9 kg   SpO2 98%   BMI 25.16 kg/m   Physical Exam Vitals and nursing note reviewed.  Constitutional:      General: He is not in acute distress.    Appearance: Normal appearance.  He is not ill-appearing.  HENT:     Head: Normocephalic and atraumatic.     Nose: Nose normal.     Mouth/Throat:     Mouth: Mucous membranes are moist.  Eyes:     Extraocular Movements: Extraocular movements intact.     Conjunctiva/sclera: Conjunctivae normal.     Pupils: Pupils are equal, round, and reactive to light.  Cardiovascular:     Rate and Rhythm: Normal rate and regular rhythm.     Pulses: Normal pulses.     Heart sounds: Normal heart sounds. No murmur heard.    No gallop.  Pulmonary:     Effort: Pulmonary effort is normal. No respiratory distress.      Breath sounds: No stridor. Wheezing present. No rhonchi or rales.  Abdominal:     General: Abdomen is flat. Bowel sounds are normal. There is no distension.     Palpations: Abdomen is soft.     Tenderness: There is no abdominal tenderness. There is no guarding.  Musculoskeletal:        General: Normal range of motion.     Cervical back: Normal range of motion and neck supple. No rigidity or tenderness.     Right lower leg: No edema.     Left lower leg: No edema.  Skin:    General: Skin is warm and dry.     Findings: No rash.  Neurological:     General: No focal deficit present.     Mental Status: He is alert and oriented to person, place, and time. Mental status is at baseline.     Cranial Nerves: No cranial nerve deficit.     Sensory: No sensory deficit.     Motor: No weakness.     Coordination: Coordination normal.     Gait: Gait normal.  Psychiatric:        Mood and Affect: Mood normal.        Behavior: Behavior normal.        Thought Content: Thought content normal.        Judgment: Judgment normal.     (all labs ordered are listed, but only abnormal results are displayed) Labs Reviewed  CBC WITH DIFFERENTIAL/PLATELET - Abnormal; Notable for the following components:      Result Value   WBC 26.2 (*)    RBC 3.78 (*)    Hemoglobin 12.0 (*)    HCT 36.0 (*)    All other components within normal limits  CULTURE, BLOOD (ROUTINE X 2)  CULTURE, BLOOD (ROUTINE X 2)  RESP PANEL BY RT-PCR (RSV, FLU A&B, COVID)  RVPGX2  COMPREHENSIVE METABOLIC PANEL WITH GFR  LACTIC ACID, PLASMA  LACTIC ACID, PLASMA  URINALYSIS, COMPLETE (UACMP) WITH MICROSCOPIC  APTT  PROTIME-INR  TYPE AND SCREEN    EKG: None  Radiology: No results found.   .Critical Care  Performed by: Daralene Lonni BIRCH, PA-C Authorized by: Daralene Lonni BIRCH, PA-C   Critical care provider statement:    Critical care time (minutes):  35   Critical care was necessary to treat or prevent imminent or  life-threatening deterioration of the following conditions:  Sepsis   Critical care was time spent personally by me on the following activities:  Development of treatment plan with patient or surrogate, discussions with consultants, evaluation of patient's response to treatment, examination of patient, ordering and review of laboratory studies, ordering and review of radiographic studies, ordering and performing treatments and interventions, pulse oximetry, re-evaluation of patient's condition and review of  old charts   I assumed direction of critical care for this patient from another provider in my specialty: no     Care discussed with: admitting provider      Medications Ordered in the ED  lactated ringers  infusion (has no administration in time range)  lactated ringers  bolus 1,000 mL (1,000 mLs Intravenous New Bag/Given 08/18/24 1402)    And  lactated ringers  bolus 1,000 mL (has no administration in time range)    And  lactated ringers  bolus 1,000 mL (has no administration in time range)  cefTRIAXone  (ROCEPHIN ) 2 g in sodium chloride  0.9 % 100 mL IVPB (has no administration in time range)  azithromycin  (ZITHROMAX ) 500 mg in sodium chloride  0.9 % 250 mL IVPB (has no administration in time range)  methylPREDNISolone  sodium succinate (SOLU-MEDROL ) 125 mg/2 mL injection 125 mg (has no administration in time range)  ipratropium-albuterol  (DUONEB) 0.5-2.5 (3) MG/3ML nebulizer solution 3 mL (has no administration in time range)                                    Medical Decision Making Amount and/or Complexity of Data Reviewed Labs: ordered. Radiology: ordered. ECG/medicine tests: ordered.  Risk Prescription drug management. Decision regarding hospitalization.   This patient presents to the ED for concern of weakness, congestion, fever, chills, this involves an extensive number of treatment options, and is a complaint that carries with it a high risk of complications and morbidity.  The  differential diagnosis includes sepsis, pneumonia, urinary tract infection, electrolyte derangement, acute kidney injury, dehydration   Co morbidities that complicate the patient evaluation  COPD, hypertension, hyperlipidemia   Additional history obtained:  Additional history obtained from none External records from outside source obtained and reviewed including none   Lab Tests:  I Ordered, and personally interpreted labs.  The pertinent results include: Leukocytosis, anemia at baseline, elevated creatinine from baseline, elevated lactic acid, normal liver function and electrolytes, negative viral swab   Imaging Studies ordered:  I ordered imaging studies including chest x-ray I independently visualized and interpreted imaging which showed no acute cardiopulmonary process I agree with the radiologist interpretation   Cardiac Monitoring: / EKG:  The patient was maintained on a cardiac monitor.  I personally viewed and interpreted the cardiac monitored which showed an underlying rhythm of: Normal sinus rhythm, no ST/T wave changes, no ischemic changes, no STEMI   Consultations Obtained:  I requested consultation with the hospitalist,  and discussed lab and imaging findings as well as pertinent plan - they recommend: Admission   Problem List / ED Course / Critical interventions / Medication management  Patient is doing well at this time and does remain stable.  He notes that his symptoms have improved while in the emergency department.  Still suspect pneumonia despite his negative chest x-ray given his ongoing symptoms.  He has been treated with Rocephin  and azithromycin .  Patient does have associated leukocytosis and lactic acidosis.  He has been given sepsis fluid bolus.  Hypotension has resolved.  He does have acute kidney injury as well.  Have discussed patient case with Dr. FORBES Carwin with the hospitalist service who has excepted for admission. I ordered medication including  DuoNeb, Solu-Medrol , Rocephin , azithromycin , IV fluids for sepsis, COPD Reevaluation of the patient after these medicines showed that the patient improved I have reviewed the patients home medicines and have made adjustments as needed   Social Determinants of  Health:  None   Test / Admission - Considered:  Admission     Final diagnoses:  None    ED Discharge Orders     None          Daralene Lonni BIRCH, PA-C 08/18/24 1645    Charlyn Sora, MD 08/19/24 [phone]

## 2024-08-18 NOTE — H&P (Signed)
 History and Physical    Cesar Gonzalez FMW:[id number] DOB: 16-Nov-1950 DOA: 08/18/2024  PCP: Marvine Rush, MD   Patient coming from: Home  I have personally briefly reviewed patient's old medical records in [facility]  Chief Complaint: Difficulty breathing, fever  HPI: Cesar Gonzalez is a 73 y.o. male with medical history significant for COPD, hypertension, gout. Patient presented to the ED with complaints of fever, congestion, fatigue, generalized weakness and difficulty breathing with exertion over the past 2 days. About 2 weeks ago, he had symptoms of congestion cough and fatigue, was diagnosed with some upper respiratory tract infection, treated with a course of amoxicillin  and prednisone .  He felt better initially, he completed the course about a week ago.  But then symptoms came back again, were worse and with fevers of up to 101.  He has poor oral intake. No chest pain, no vomiting no diarrhea no lower extremity swelling.  He went to the urgent care today with hypoxia and low blood pressure-94/58 he was sent to the ED.  ED Course: Temperature 98.2.  Heart rate 91-102.  Respiratory rate 19-31.  Blood pressure systolic 97-121.  O2 sats 95 to 100% on room air. WBC 26.3. Creatinine 2.09.  Lactic acid 2.6.  Chest x-ray negative for acute abnormality. 3 L bolus given.  IV ceftriaxone  and azithromycin  ordered.  Solu-Medrol  125 mg x 1 given.  Review of Systems: As per HPI all other systems reviewed and negative.  Past Medical History:  Diagnosis Date   Anxiety    COPD (chronic obstructive pulmonary disease) (HCC) 09/17/2018   Essential hypertension    Gout    Mixed hyperlipidemia    Nephritis    Pneumonia     Past Surgical History:  Procedure Laterality Date   TONSILLECTOMY       reports that he quit smoking about 3 years ago. His smoking use included cigarettes. He started smoking about 23 years ago. He has a 20 pack-year smoking history. He has never used  smokeless tobacco. He reports that he does not currently use alcohol. He reports that he does not use drugs.  No Known Allergies  Family History  Problem Relation Age of Onset   Diabetes Mother    Osteoporosis Mother    COPD Father    Prostate cancer Father    Stroke Brother    Heart failure Brother    Heart attack Maternal Grandfather    Brain cancer Paternal Grandmother    Stroke Paternal Grandfather    Prior to Admission medications   Medication Sig Start Date End Date Taking? Authorizing Provider  albuterol  (PROVENTIL  HFA;VENTOLIN  HFA) 108 (90 Base) MCG/ACT inhaler Inhale 2 puffs into the lungs every 4 (four) hours as needed for wheezing or shortness of breath. 09/18/18  Yes Johnson, Clanford L, MD  ALPRAZolam  (XANAX ) 1 MG tablet Take 1 mg by mouth 3 (three) times daily as needed for anxiety. 02/26/22  Yes [provider]  amLODipine (NORVASC) 5 MG tablet Take 5 mg by mouth daily. 08/13/24  Yes [provider]  Apple Cider Vinegar 500 MG TABS Take 1 tablet by mouth daily.   Yes [provider]  ASHWAGANDHA GUMMIES PO Take 1 tablet by mouth daily.   Yes [provider]  Barberry-Oreg Grape-Goldenseal (BERBERINE COMPLEX PO) Take 1 tablet by mouth daily.   Yes [provider]  cholecalciferol (VITAMIN D3) 25 MCG (1000 UNIT) tablet Take 1,000 Units by mouth daily.   Yes [provider]  clobetasol cream (TEMOVATE) 0.05 % Apply 1 Application topically 2 (two) times daily. Gently cover the entire affected area of upper and lower extremities. Do not apply to eyes 08/13/24  Yes [provider]  GINKGO BILOBA COMPLEX PO Take 1 capsule by mouth daily.   Yes [provider]  hydrochlorothiazide (HYDRODIURIL) 12.5 MG tablet Take 12.5 mg by mouth every morning. 05/13/24  Yes [provider]  Misc Natural Products (TESTOPLEX PLUS PO) Take 1 capsule by mouth daily.   Yes [provider]  Multiple  Vitamins-Minerals (SUPER MULTI-VITAMIN PO) Take 1 tablet by mouth daily.   Yes [provider]  olmesartan (BENICAR) 40 MG tablet Take 40 mg by mouth daily.   Yes [provider]  Omega-3 Fatty Acids (FISH OIL) 1000 MG CAPS Take 2,000 mg by mouth daily.   Yes [provider]  Probiotic Product (PROBIOTIC ADVANCED PO) Take 1 capsule by mouth daily.   Yes [provider]  rosuvastatin  (CRESTOR ) 5 MG tablet Take 5 mg by mouth daily.   Yes [provider]  SYMBICORT 160-4.5 MCG/ACT inhaler Inhale 2 puffs into the lungs in the morning and at bedtime. 03/21/23  Yes [provider]  Turmeric (QC TUMERIC COMPLEX PO) Take 1 capsule by mouth daily. Ginger   Yes [provider]  vitamin k 100 MCG tablet Take 100 mcg by mouth daily.   Yes [provider]  amoxicillin -clavulanate (AUGMENTIN ) 875-125 MG tablet Take 1 tablet by mouth every 12 (twelve) hours. Patient not taking: Reported on 08/18/2024 08/06/24   Leath-Warren, Etta PARAS, NP  fluticasone (FLONASE) 50 MCG/ACT nasal spray Place 2 sprays into both nostrils daily. Patient not taking: Reported on 08/18/2024 08/06/24   Gilmer Etta PARAS, NP    Physical Exam: Vitals:   08/18/24 1309 08/18/24 1400 08/18/24 1430 08/18/24 1700  BP:  98/60 (!) 108/59 121/65  Pulse:  (!) 102 91 93  Resp:   (!) 31 19  Temp:    98.1 F (36.7 C)  TempSrc:    Oral  SpO2:  100% 95% 96%  Weight: 88.9 kg     Height: 6' 2 (1.88 m)       Constitutional: NAD, calm, comfortable Vitals:   08/18/24 1309 08/18/24 1400 08/18/24 1430 08/18/24 1700  BP:  98/60 (!) 108/59 121/65  Pulse:  (!) 102 91 93  Resp:   (!) 31 19  Temp:    98.1 F (36.7 C)  TempSrc:    Oral  SpO2:  100% 95% 96%  Weight: 88.9 kg     Height: 6' 2 (1.88 m)      Eyes: PERRL, lids and conjunctivae normal ENMT: Mucous membranes are moist.  Neck: normal, supple, no masses, no thyromegaly Respiratory: clear to auscultation  bilaterally, no wheezing, no crackles. Normal respiratory effort. No accessory muscle use.  Cardiovascular: Regular rate and rhythm, no murmurs / rubs / gallops. No extremity edema..  Abdomen: no tenderness, no masses palpated. No hepatosplenomegaly. Bowel sounds positive.  Musculoskeletal: no clubbing / cyanosis. No joint deformity upper and lower extremities. Skin: no rashes, lesions, ulcers. No induration Neurologic: No facial asymmetry, moves extremities spontaneously, speech fluent Psychiatric: Normal judgment and insight. Alert and oriented x 3. Normal mood.   Labs on Admission: I have personally reviewed following labs and imaging studies  CBC: Recent Labs  Lab 08/18/24 1353  WBC 26.2*  NEUTROABS 22.1*  HGB 12.0*  HCT 36.0*  MCV 95.2  PLT 239   Basic Metabolic Panel:  Recent Labs  Lab 08/18/24 1353  NA 137  K 4.0  CL 97*  CO2 28  GLUCOSE 105*  BUN 32*  CREATININE 2.09*  CALCIUM  9.4   GFR: Estimated Creatinine Clearance: 37.1 mL/min (A) (by C-G formula based on SCr of 2.09 mg/dL (H)). Liver Function Tests: Recent Labs  Lab 08/18/24 1353  AST 27  ALT 26  ALKPHOS 87  BILITOT 0.4  PROT 7.5  ALBUMIN 4.4   Coagulation Profile: Recent Labs  Lab 08/18/24 1353  INR 1.2   Radiological Exams on Admission: CT CHEST WO CONTRAST Result Date: 08/18/2024 EXAM: CT CHEST WITHOUT CONTRAST 08/18/2024 04:40:11 PM TECHNIQUE: CT of the chest was performed without the administration of intravenous contrast. Multiplanar reformatted images are provided for review. Automated exposure control, iterative reconstruction, and/or weight based adjustment of the mA/kV was utilized to reduce the radiation dose to as low as reasonably achievable. COMPARISON: 04/11/2023 CLINICAL HISTORY: Sepsis, Cough, hypoxia, dyspnea. FINDINGS: MEDIASTINUM: Heart and pericardium are unremarkable. Coronary artery and aortic atherosclerosis. The central airways are clear. LYMPH NODES: No mediastinal, hilar or  axillary lymphadenopathy. LUNGS AND PLEURA: Mild centrilobular emphysema. Improved airspace opacity in the right upper lobe and right lower lobe. Dependent atelectasis in the lower lobes. Patchy nodular ground-glass opacities in the left lower lobe and posterior left upper lobe, likely infectious or inflammatory processes such as pneumonia or bronchiolitis. No pleural effusion or pneumothorax. SOFT TISSUES/BONES: No acute abnormality of the bones or soft tissues. UPPER ABDOMEN: Limited images of the upper abdomen demonstrates no acute abnormality. IMPRESSION: 1. Patchy nodular ground-glass opacities in the left lower and posterior left upper lobes, likely infectious or inflammatory such as pneumonia or bronchiolitis. 2. Improved airspace opacities in the right upper and right lower lobes. 3. Mild centrilobular emphysema. Electronically signed by: Franky Crease MD 08/18/2024 05:06 PM EST RP Workstation: [id number]   DG Chest Port 1 View Result Date: 08/18/2024 CLINICAL DATA:  Sepsis EXAM: PORTABLE CHEST 1 VIEW COMPARISON:  05/29/2023 FINDINGS: Two frontal views of the chest demonstrate an unremarkable cardiac silhouette. No acute airspace disease, effusion, or pneumothorax. Stable background scarring. No acute bony abnormalities. IMPRESSION: 1. No acute intrathoracic process. Electronically Signed   By: Ozell Daring M.D.   On: 08/18/2024 14:03    EKG: Independently reviewed.  Sinus rhythm, rate 97, QTc 436.  No significant change from prior.  Assessment/Plan Principal Problem:   PNA (pneumonia) Active Problems:   Severe sepsis (HCC)   AKI (acute kidney injury)   COPD (chronic obstructive pulmonary disease) (HCC)  Assessment and Plan: No notes have been filed under this hospital service. Service: Hospitalist  Pneumonia with severe sepsis-presenting with dyspnea on exertion, cough.  Meeting severe sepsis criteria with tachycardia heart rate 91-102, leukocytosis of 26.2.  With evidence of endorgan  dysfunction AKI, and lactic acidosis of 2.6. > 1.3.  X-ray negative for acute abnormality.  COVID influenza RSV negative.  - CTA chest without contrast- Patchy nodular ground-glass opacities in the left lower and posterior left upper lobes, likely infectious or inflammatory such as pneumonia or bronchiolitis. - Follow-up blood cultures - 3 L bolus given, continue LR at 100 cc/h - IV ceftriaxone  and azithromycin  - Mucolytics - UA pending  AKI-creatinine 2.09, baseline 1.1.  Likely from poor oral intake and ARB's. - Hydrate - Hold home losartan  COPD-stable.  No rhonchi or wheezing on exam. -DuoNebs as needed - 125 mg Solu-Medrol  given, hold further steroids for now  HTN - Initial hypotension, hold Norvasc HCTZ olmesartan  for now  DVT prophylaxis: heparin  Code Status: FULL Family Communication: None at bedside Disposition Plan: ~/> 2 days Consults called: None Admission status: Inpt tele I certify that at the point of admission it is my clinical judgment that the patient will require inpatient hospital care spanning beyond 2 midnights from the point of admission due to high intensity of service, high risk for further deterioration and high frequency of surveillance required.   Author: Tully FORBES Carwin, MD 08/18/2024 5:34 PM  For on call review [email or web address].

## 2024-08-19 DIAGNOSIS — N1831 Chronic kidney disease, stage 3a: Secondary | ICD-10-CM

## 2024-08-19 DIAGNOSIS — N179 Acute kidney failure, unspecified: Secondary | ICD-10-CM | POA: Diagnosis not present

## 2024-08-19 DIAGNOSIS — J181 Lobar pneumonia, unspecified organism: Secondary | ICD-10-CM | POA: Diagnosis not present

## 2024-08-19 DIAGNOSIS — A419 Sepsis, unspecified organism: Secondary | ICD-10-CM | POA: Diagnosis not present

## 2024-08-19 DIAGNOSIS — R6520 Severe sepsis without septic shock: Secondary | ICD-10-CM | POA: Diagnosis not present

## 2024-08-19 LAB — BASIC METABOLIC PANEL WITH GFR
Anion gap: 8 (ref 5–15)
BUN: 31 mg/dL — ABNORMAL HIGH (ref 8–23)
CO2: 26 mmol/L (ref 22–32)
Calcium: 8.9 mg/dL (ref 8.9–10.3)
Chloride: 100 mmol/L (ref 98–111)
Creatinine, Ser: 1.55 mg/dL — ABNORMAL HIGH (ref 0.61–1.24)
GFR, Estimated: 47 mL/min — ABNORMAL LOW (ref >60)
Glucose, Bld: 207 mg/dL — ABNORMAL HIGH (ref 70–99)
Potassium: 4.2 mmol/L (ref 3.5–5.1)
Sodium: 134 mmol/L — ABNORMAL LOW (ref 135–145)

## 2024-08-19 LAB — CBC
HCT: 30.9 % — ABNORMAL LOW (ref 39.0–52.0)
Hemoglobin: 10.4 g/dL — ABNORMAL LOW (ref 13.0–17.0)
MCH: 31.8 pg (ref 26.0–34.0)
MCHC: 33.7 g/dL (ref 30.0–36.0)
MCV: 94.5 fL (ref 80.0–100.0)
Platelets: 207 K/uL (ref 150–400)
RBC: 3.27 MIL/uL — ABNORMAL LOW (ref 4.22–5.81)
RDW: 11.6 % (ref 11.5–15.5)
WBC: 11.5 K/uL — ABNORMAL HIGH (ref 4.0–10.5)
nRBC: 0 % (ref 0.0–0.2)

## 2024-08-19 LAB — TSH: TSH: 0.157 u[IU]/mL — ABNORMAL LOW (ref 0.350–4.500)

## 2024-08-19 LAB — PROCALCITONIN: Procalcitonin: 0.22 ng/mL

## 2024-08-19 LAB — HEMOGLOBIN A1C
Hgb A1c MFr Bld: 5 % (ref 4.8–5.6)
Mean Plasma Glucose: 96.8 mg/dL

## 2024-08-19 LAB — VITAMIN B12: Vitamin B-12: 787 pg/mL (ref 180–914)

## 2024-08-19 LAB — T4, FREE: Free T4: 1.11 ng/dL (ref 0.61–1.12)

## 2024-08-19 LAB — FOLATE: Folate: 7.2 ng/mL (ref >5.9)

## 2024-08-19 MED ORDER — LACTATED RINGERS IV SOLN: INTRAVENOUS | Status: AC

## 2024-08-19 NOTE — Plan of Care (Signed)

## 2024-08-19 NOTE — Progress Notes (Signed)
   08/19/24 1001  TOC Brief Assessment  Insurance and Status Reviewed  Patient has primary care physician Yes  Home environment has been reviewed Home with spouse  Prior level of function: Independent  Prior/Current Home Services No current home services  Social Drivers of Health Review SDOH reviewed no interventions necessary  Readmission risk has been reviewed Yes  Transition of care needs no transition of care needs at this time   Inpatient Care Manager (ICM) has reviewed patient and no ICM needs have been identified at this time. We will continue to monitor patient advancement through interdisciplinary progression rounds. If new patient transition needs arise, please place a ICM consult.

## 2024-08-19 NOTE — Hospital Course (Signed)
 73 year old male with a history of COPD, hypertension, gouty arthritis, hyperlipidemia, anxiety presenting with malaise/generalized weakness, fevers, chest congestion, and shortness of breath for 2 days. The patient has similar symptoms 2 weeks prior to admission.  He went to urgent care  who prescribed the patient Augmentin  on 08/06/2024 and prednisone .  Patient states that he finished the antibiotics on 08/12/2024.  He felt somewhat better at that time, but since then he began to develop similar symptoms 08/16/2024.  As result, he presented for the evaluation and treatment.  He quit smoking 2 to 3 years ago after a 30-pack-year history.  He denies any headache, chest pain, hemoptysis, nausea, vomiting, diarrhea, abdominal pain.  He denies dysuria or hematuria. In the ED, the patient was afebrile and hemodynamically stable with oxygen saturation 93-96% room air.  WBC 26.2, hemoglobin 12.0, platelets 339.  Sodium 137, potassium 4.0, bicarbonate 20, serum creatinine 2.09.  Lactic acid 2.6 >> 1.3.  Patient was started on IV fluids, ceftriaxone , azithromycin .

## 2024-08-19 NOTE — Progress Notes (Addendum)
 PROGRESS NOTE  Cesar Gonzalez FMW:[id number] DOB: 07/01/1951 DOA: 08/18/2024 PCP: Marvine Rush, MD  Brief History:  73 year old male with a history of COPD, hypertension, gouty arthritis, hyperlipidemia, anxiety presenting with malaise/generalized weakness, fevers, chest congestion, and shortness of breath for 2 days. The patient has similar symptoms 2 weeks prior to admission.  He went to urgent care  who prescribed the patient Augmentin  on 08/06/2024 and prednisone .  Patient states that he finished the antibiotics on 08/12/2024.  He felt somewhat better at that time, but since then he began to develop similar symptoms 08/16/2024.  As result, he presented for the evaluation and treatment.  He quit smoking 2 to 3 years ago after a 30-pack-year history.  He denies any headache, chest pain, hemoptysis, nausea, vomiting, diarrhea, abdominal pain.  He denies dysuria or hematuria. In the ED, the patient was afebrile and hemodynamically stable with oxygen saturation 93-96% room air.  WBC 26.2, hemoglobin 12.0, platelets 339.  Sodium 137, potassium 4.0, bicarbonate 20, serum creatinine 2.09.  Lactic acid 2.6 >> 1.3.  Patient was started on IV fluids, ceftriaxone , azithromycin .   Assessment/Plan: Severe sepsis - Patient presented with leukocytosis, tachycardia, tachypnea - Secondary to pneumonia - Lactic acid 2.6>> 1.3 - Continue IV fluids - Follow-up blood cultures - UA negative for pyuria  Lobar pneumonia - 08/18/24 CT chest>> patchy nodular GGO LLL and posterior LUL; improved RUL and RLL opacities. - Check PCT - Continue ceftriaxone  and azithromycin  - Continue IV fluids - Urine Legionella antigen  Acute on chronic renal failure--CKD stage IIIa - Baseline creatinine 1.1-1.4 - Presented with serum creatinine 2.09 - Secondary to volume depletion and sepsis - Continue IV fluids>> improving  Essential hypertension - Holding amlodipine and HCTZ secondary to soft blood  pressure  Mixed hyperlipidemia - Continue statin  COPD - Continue Breo  Anxiety - Continue Xanax  - PDMP reviewed - Xanax  1 mg, 90, last refill 08/05/2024  Hyperglycemia - Check hemoglobin A1c     Family Communication:   no Family at bedside  Consultants:  none  Code Status:  FULL   DVT Prophylaxis:  [place] Heparin     Procedures: As Listed in Progress Note Above  Antibiotics: Ceftriaxone  11/2>> Azithro 11/2>>      Subjective: Patient has a nonproductive cough.  He feels that his breathing is improving.  He denies any nausea, vomiting, diarrhea, abdominal pain.  He denies any headache or neck pain.  Objective: Vitals:   08/18/24 1430 08/18/24 1700 08/18/24 1937 08/19/24 0719  BP: (!) 108/59 121/65 113/65   Pulse: 91 93 78   Resp: (!) 31 19 18    Temp:  98.1 F (36.7 C) 98.9 F (37.2 C)   TempSrc:  Oral Oral   SpO2: 95% 96% 92% 93%  Weight:      Height:        Intake/Output Summary (Last 24 hours) at 08/19/2024 0818 Last data filed at 08/18/2024 2100 Gross per 24 hour  Intake 283.85 ml  Output 300 ml  Net -16.15 ml   Weight change:  Exam:  General:  Pt is alert, follows commands appropriately, not in acute distress HEENT: No icterus, No thrush, No neck mass, [place]/AT Cardiovascular: RRR, S1/S2, no rubs, no gallops Respiratory: Scattered bilateral rales.  Diminished breath sounds.  No wheezing Abdomen: Soft/+BS, non tender, non distended, no guarding Extremities: No edema, No lymphangitis, No petechiae, No rashes, no synovitis   Data Reviewed: I have personally reviewed following  labs and imaging studies Basic Metabolic Panel: Recent Labs  Lab 08/18/24 1353 08/19/24 0410  NA 137 134*  K 4.0 4.2  CL 97* 100  CO2 28 26  GLUCOSE 105* 207*  BUN 32* 31*  CREATININE 2.09* 1.55*  CALCIUM  9.4 8.9   Liver Function Tests: Recent Labs  Lab 08/18/24 1353  AST 27  ALT 26  ALKPHOS 87  BILITOT 0.4  PROT 7.5  ALBUMIN 4.4   No results for  input(s): LIPASE, AMYLASE in the last 168 hours. No results for input(s): AMMONIA in the last 168 hours. Coagulation Profile: Recent Labs  Lab 08/18/24 1353  INR 1.2   CBC: Recent Labs  Lab 08/18/24 1353 08/19/24 0410  WBC 26.2* 11.5*  NEUTROABS 22.1*  --   HGB 12.0* 10.4*  HCT 36.0* 30.9*  MCV 95.2 94.5  PLT 239 207   Cardiac Enzymes: No results for input(s): CKTOTAL, CKMB, CKMBINDEX, TROPONINI in the last 168 hours. BNP: Invalid input(s): POCBNP CBG: No results for input(s): GLUCAP in the last 168 hours. HbA1C: No results for input(s): HGBA1C in the last 72 hours. Urine analysis:    Component Value Date/Time   COLORURINE AMBER (A) 08/18/2024 2107   APPEARANCEUR HAZY (A) 08/18/2024 2107   LABSPEC 1.010 08/18/2024 2107   PHURINE 5.0 08/18/2024 2107   GLUCOSEU NEGATIVE 08/18/2024 2107   HGBUR NEGATIVE 08/18/2024 2107   BILIRUBINUR NEGATIVE 08/18/2024 2107   BILIRUBINUR negative 06/19/2024 0854   KETONESUR NEGATIVE 08/18/2024 2107   PROTEINUR NEGATIVE 08/18/2024 2107   UROBILINOGEN 0.2 06/19/2024 0854   UROBILINOGEN 0.2 12/12/2014 1400   NITRITE NEGATIVE 08/18/2024 2107   LEUKOCYTESUR NEGATIVE 08/18/2024 2107   Sepsis Labs: @LABRCNTIP (procalcitonin:4,lacticidven:4) ) Recent Results (from the past 240 hours)  Resp panel by RT-PCR (RSV, Flu A&B, Covid) Anterior Nasal Swab     Status: None   Collection Time: 08/18/24  1:32 PM   Specimen: Anterior Nasal Swab  Result Value Ref Range Status   SARS Coronavirus 2 by RT PCR NEGATIVE NEGATIVE Final    Comment: (NOTE) SARS-CoV-2 target nucleic acids are NOT DETECTED.  The SARS-CoV-2 RNA is generally detectable in upper respiratory specimens during the acute phase of infection. The lowest concentration of SARS-CoV-2 viral copies this assay can detect is 138 copies/mL. A negative result does not preclude SARS-Cov-2 infection and should not be used as the sole basis for treatment or other patient  management decisions. A negative result may occur with  improper specimen collection/handling, submission of specimen other than nasopharyngeal swab, presence of viral mutation(s) within the areas targeted by this assay, and inadequate number of viral copies(<138 copies/mL). A negative result must be combined with clinical observations, patient history, and epidemiological information. The expected result is Negative.  Fact Sheet for Patients:  bloggercourse.com  Fact Sheet for Healthcare Providers:  seriousbroker.it  This test is no t yet approved or cleared by the [place]  FDA and  has been authorized for detection and/or diagnosis of SARS-CoV-2 by FDA under an Emergency Use Authorization (EUA). This EUA will remain  in effect (meaning this test can be used) for the duration of the COVID-19 declaration under Section 564(b)(1) of the Act, 21 U.S.C.section 360bbb-3(b)(1), unless the authorization is terminated  or revoked sooner.       Influenza A by PCR NEGATIVE NEGATIVE Final   Influenza B by PCR NEGATIVE NEGATIVE Final    Comment: (NOTE) The Xpert Xpress SARS-CoV-2/FLU/RSV plus assay is intended as an aid in the diagnosis of influenza from  Nasopharyngeal swab specimens and should not be used as a sole basis for treatment. Nasal washings and aspirates are unacceptable for Xpert Xpress SARS-CoV-2/FLU/RSV testing.  Fact Sheet for Patients: bloggercourse.com  Fact Sheet for Healthcare Providers: seriousbroker.it  This test is not yet approved or cleared by the [place]  FDA and has been authorized for detection and/or diagnosis of SARS-CoV-2 by FDA under an Emergency Use Authorization (EUA). This EUA will remain in effect (meaning this test can be used) for the duration of the COVID-19 declaration under Section 564(b)(1) of the Act, 21 U.S.C. section 360bbb-3(b)(1),  unless the authorization is terminated or revoked.     Resp Syncytial Virus by PCR NEGATIVE NEGATIVE Final    Comment: (NOTE) Fact Sheet for Patients: bloggercourse.com  Fact Sheet for Healthcare Providers: seriousbroker.it  This test is not yet approved or cleared by the [place]  FDA and has been authorized for detection and/or diagnosis of SARS-CoV-2 by FDA under an Emergency Use Authorization (EUA). This EUA will remain in effect (meaning this test can be used) for the duration of the COVID-19 declaration under Section 564(b)(1) of the Act, 21 U.S.C. section 360bbb-3(b)(1), unless the authorization is terminated or revoked.  Performed at [facility], [address], [place], [place] [address]   Culture, blood (x 2)     Status: None (Preliminary result)   Collection Time: 08/18/24  1:53 PM   Specimen: BLOOD LEFT ARM  Result Value Ref Range Status   Specimen Description BLOOD LEFT ARM BOTTLES DRAWN AEROBIC AND ANAEROBIC  Final   Special Requests Blood Culture adequate volume  Final   Culture   Final    NO GROWTH < 24 HOURS Performed at [facility], [address], [place], [place] [address]    Report Status PENDING  Incomplete  Culture, blood (x 2)     Status: None (Preliminary result)   Collection Time: 08/18/24  1:53 PM   Specimen: BLOOD RIGHT ARM  Result Value Ref Range Status   Specimen Description   Final    BLOOD RIGHT ARM BOTTLES DRAWN AEROBIC AND ANAEROBIC   Special Requests Blood Culture adequate volume  Final   Culture   Final    NO GROWTH < 24 HOURS Performed at [facility], [address] [place], [place] [address]    Report Status PENDING  Incomplete     Scheduled Meds:  fluticasone furoate-vilanterol  1 puff Inhalation Daily   guaiFENesin -dextromethorphan  15 mL Oral Q8H   heparin   5,000 Units Subcutaneous Q8H   rosuvastatin   5 mg Oral QHS   Continuous Infusions:  azithromycin      cefTRIAXone   (ROCEPHIN )  IV     lactated ringers  100 mL/hr at 08/18/24 1939    Procedures/Studies: CT CHEST WO CONTRAST Result Date: 08/18/2024 EXAM: CT CHEST WITHOUT CONTRAST 08/18/2024 04:40:11 PM TECHNIQUE: CT of the chest was performed without the administration of intravenous contrast. Multiplanar reformatted images are provided for review. Automated exposure control, iterative reconstruction, and/or weight based adjustment of the mA/kV was utilized to reduce the radiation dose to as low as reasonably achievable. COMPARISON: 04/11/2023 CLINICAL HISTORY: Sepsis, Cough, hypoxia, dyspnea. FINDINGS: MEDIASTINUM: Heart and pericardium are unremarkable. Coronary artery and aortic atherosclerosis. The central airways are clear. LYMPH NODES: No mediastinal, hilar or axillary lymphadenopathy. LUNGS AND PLEURA: Mild centrilobular emphysema. Improved airspace opacity in the right upper lobe and right lower lobe. Dependent atelectasis in the lower lobes. Patchy nodular ground-glass opacities in the left lower lobe and posterior left upper lobe,  likely infectious or inflammatory processes such as pneumonia or bronchiolitis. No pleural effusion or pneumothorax. SOFT TISSUES/BONES: No acute abnormality of the bones or soft tissues. UPPER ABDOMEN: Limited images of the upper abdomen demonstrates no acute abnormality. IMPRESSION: 1. Patchy nodular ground-glass opacities in the left lower and posterior left upper lobes, likely infectious or inflammatory such as pneumonia or bronchiolitis. 2. Improved airspace opacities in the right upper and right lower lobes. 3. Mild centrilobular emphysema. Electronically signed by: Franky Crease MD 08/18/2024 05:06 PM EST RP Workstation: [id number]   DG Chest Port 1 View Result Date: 08/18/2024 CLINICAL DATA:  Sepsis EXAM: PORTABLE CHEST 1 VIEW COMPARISON:  05/29/2023 FINDINGS: Two frontal views of the chest demonstrate an unremarkable cardiac silhouette. No acute airspace disease, effusion, or  pneumothorax. Stable background scarring. No acute bony abnormalities. IMPRESSION: 1. No acute intrathoracic process. Electronically Signed   By: Ozell Daring M.D.   On: 08/18/2024 14:03    Alm Schneider, DO  [facility]  If 7PM-7AM, please contact night-coverage [email or web address] Password [email or web address] 08/19/2024, 8:18 AM   LOS: 1 day

## 2024-08-19 NOTE — Plan of Care (Signed)
   Problem: Activity: Goal: Risk for activity intolerance will decrease Outcome: Progressing   Problem: Nutrition: Goal: Adequate nutrition will be maintained Outcome: Progressing   Problem: Coping: Goal: Level of anxiety will decrease Outcome: Progressing

## 2024-08-20 DIAGNOSIS — J181 Lobar pneumonia, unspecified organism: Secondary | ICD-10-CM | POA: Diagnosis not present

## 2024-08-20 DIAGNOSIS — N179 Acute kidney failure, unspecified: Secondary | ICD-10-CM | POA: Diagnosis not present

## 2024-08-20 DIAGNOSIS — A419 Sepsis, unspecified organism: Secondary | ICD-10-CM | POA: Diagnosis not present

## 2024-08-20 DIAGNOSIS — R6520 Severe sepsis without septic shock: Secondary | ICD-10-CM | POA: Diagnosis not present

## 2024-08-20 LAB — BASIC METABOLIC PANEL WITH GFR
Anion gap: 7 (ref 5–15)
BUN: 29 mg/dL — ABNORMAL HIGH (ref 8–23)
CO2: 28 mmol/L (ref 22–32)
Calcium: 8.7 mg/dL — ABNORMAL LOW (ref 8.9–10.3)
Chloride: 104 mmol/L (ref 98–111)
Creatinine, Ser: 1.45 mg/dL — ABNORMAL HIGH (ref 0.61–1.24)
GFR, Estimated: 51 mL/min — ABNORMAL LOW (ref >60)
Glucose, Bld: 110 mg/dL — ABNORMAL HIGH (ref 70–99)
Potassium: 4.6 mmol/L (ref 3.5–5.1)
Sodium: 139 mmol/L (ref 135–145)

## 2024-08-20 LAB — CBC
HCT: 30.4 % — ABNORMAL LOW (ref 39.0–52.0)
Hemoglobin: 10.1 g/dL — ABNORMAL LOW (ref 13.0–17.0)
MCH: 31.7 pg (ref 26.0–34.0)
MCHC: 33.2 g/dL (ref 30.0–36.0)
MCV: 95.3 fL (ref 80.0–100.0)
Platelets: 238 K/uL (ref 150–400)
RBC: 3.19 MIL/uL — ABNORMAL LOW (ref 4.22–5.81)
RDW: 11.8 % (ref 11.5–15.5)
WBC: 13 K/uL — ABNORMAL HIGH (ref 4.0–10.5)
nRBC: 0 % (ref 0.0–0.2)

## 2024-08-20 LAB — MAGNESIUM: Magnesium: 1.9 mg/dL (ref 1.7–2.4)

## 2024-08-20 MED ORDER — CEFDINIR 300 MG PO CAPS: 300.0000 mg | ORAL_CAPSULE | Freq: Two times a day (BID) | ORAL | 0 refills | Status: AC

## 2024-08-20 MED ORDER — AZITHROMYCIN 500 MG PO TABS: 500.0000 mg | ORAL_TABLET | Freq: Every day | ORAL | 0 refills | Status: DC

## 2024-08-20 MED ORDER — FOLIC ACID 1 MG PO TABS: 1.0000 mg | ORAL_TABLET | Freq: Every day | ORAL | Status: AC

## 2024-08-20 MED ORDER — AZITHROMYCIN 250 MG PO TABS
500.0000 mg | ORAL_TABLET | Freq: Every day | ORAL | Status: DC
  Administered 2024-08-20: 500 mg via ORAL
  Filled 2024-08-20: qty 2

## 2024-08-20 MED ORDER — FOLIC ACID 1 MG PO TABS
1.0000 mg | ORAL_TABLET | Freq: Every day | ORAL | Status: DC
  Administered 2024-08-20: 1 mg via ORAL
  Filled 2024-08-20: qty 1

## 2024-08-20 NOTE — Discharge Summary (Signed)
 Physician Discharge Summary   Patient: Cesar Gonzalez MRN: [id number] DOB: 1950/12/28  Admit date:     08/18/2024  Discharge date: 08/20/24  Discharge Physician: Alm Alanna Storti   PCP: Marvine Rush, MD   Recommendations at discharge:   Please follow up with primary care provider within 1-2 weeks  Please repeat BMP and CBC in one week   Hospital Course: 73 year old male with a history of COPD, hypertension, gouty arthritis, hyperlipidemia, anxiety presenting with malaise/generalized weakness, fevers, chest congestion, and shortness of breath for 2 days. The patient has similar symptoms 2 weeks prior to admission.  He went to urgent care  who prescribed the patient Augmentin  on 08/06/2024 and prednisone .  Patient states that he finished the antibiotics on 08/12/2024.  He felt somewhat better at that time, but since then he began to develop similar symptoms 08/16/2024.  As result, he presented for the evaluation and treatment.  He quit smoking 2 to 3 years ago after a 30-pack-year history.  He denies any headache, chest pain, hemoptysis, nausea, vomiting, diarrhea, abdominal pain.  He denies dysuria or hematuria. In the ED, the patient was afebrile and hemodynamically stable with oxygen saturation 93-96% room air.  WBC 26.2, hemoglobin 12.0, platelets 339.  Sodium 137, potassium 4.0, bicarbonate 20, serum creatinine 2.09.  Lactic acid 2.6 >> 1.3.  Patient was started on IV fluids, ceftriaxone , azithromycin .  Assessment and Plan: Severe sepsis - Patient presented with leukocytosis, tachycardia, tachypnea - Secondary to pneumonia - Lactic acid 2.6>> 1.3 - Continue IV fluids - Follow-up blood cultures - UA negative for pyuria - sepsis physiology resolved   Lobar pneumonia - 08/18/24 CT chest>> patchy nodular GGO LLL and posterior LUL; improved RUL and RLL opacities. - Check PCT--0.22 - Continue ceftriaxone  and azithromycin  - Continued IV fluids - Urine Legionella antigen - dc home with  cefdinir  and azithro x 4 more days   Acute on chronic renal failure--CKD stage IIIa - Baseline creatinine 1.1-1.4 - Presented with serum creatinine 2.09 - Secondary to volume depletion and sepsis - Continue IV fluids>> improving - serum creatinine 1.45 on day of d/c   Essential hypertension - Holding amlodipine and HCTZ secondary to soft blood pressure -will not restart olmesartan or hydrochlorothiazide - restart amlodipine   Mixed hyperlipidemia - Continue statin   COPD - Continue Breo   Anxiety - Continue Xanax  - PDMP reviewed - Xanax  1 mg, 90, last refill 08/05/2024   Hyperglycemia - 11/3 hemoglobin A1c--5.0      Consultants: none Procedures performed: none  Disposition: Home Diet recommendation:  Cardiac diet DISCHARGE MEDICATION: Allergies as of 08/20/2024   No Known Allergies      Medication List     STOP taking these medications    amoxicillin -clavulanate 875-125 MG tablet Commonly known as: AUGMENTIN    fluticasone 50 MCG/ACT nasal spray Commonly known as: FLONASE   hydrochlorothiazide 12.5 MG tablet Commonly known as: HYDRODIURIL   olmesartan 40 MG tablet Commonly known as: BENICAR       TAKE these medications    albuterol  108 (90 Base) MCG/ACT inhaler Commonly known as: VENTOLIN  HFA Inhale 2 puffs into the lungs every 4 (four) hours as needed for wheezing or shortness of breath.   ALPRAZolam  1 MG tablet Commonly known as: XANAX  Take 1 mg by mouth 3 (three) times daily as needed for anxiety.   amLODipine 5 MG tablet Commonly known as: NORVASC Take 5 mg by mouth daily.   Apple Cider Vinegar 500 MG Tabs Take 1 tablet by  mouth daily.   ASHWAGANDHA GUMMIES PO Take 1 tablet by mouth daily.   azithromycin  500 MG tablet Commonly known as: ZITHROMAX  Take 1 tablet (500 mg total) by mouth daily.   BERBERINE COMPLEX PO Take 1 tablet by mouth daily.   cefdinir  300 MG capsule Commonly known as: OMNICEF  Take 1 capsule (300 mg total)  by mouth 2 (two) times daily.   cholecalciferol 25 MCG (1000 UNIT) tablet Commonly known as: VITAMIN D3 Take 1,000 Units by mouth daily.   clobetasol cream 0.05 % Commonly known as: TEMOVATE Apply 1 Application topically 2 (two) times daily. Gently cover the entire affected area of upper and lower extremities. Do not apply to eyes   Fish Oil 1000 MG Caps Take 2,000 mg by mouth daily.   folic acid 1 MG tablet Commonly known as: FOLVITE Take 1 tablet (1 mg total) by mouth daily. Start taking on: August 21, 2024   [facility] BILOBA COMPLEX PO Take 1 capsule by mouth daily.   PROBIOTIC ADVANCED PO Take 1 capsule by mouth daily.   QC TUMERIC COMPLEX PO Take 1 capsule by mouth daily. Ginger   rosuvastatin  5 MG tablet Commonly known as: CRESTOR  Take 5 mg by mouth daily.   SUPER MULTI-VITAMIN PO Take 1 tablet by mouth daily.   Symbicort 160-4.5 MCG/ACT inhaler Generic drug: budesonide-formoterol  Inhale 2 puffs into the lungs in the morning and at bedtime.   TESTOPLEX PLUS PO Take 1 capsule by mouth daily.   vitamin k 100 MCG tablet Take 100 mcg by mouth daily.        Discharge Exam: Filed Weights   08/18/24 1309  Weight: 88.9 kg   HEENT:  [place]/AT, No thrush, no icterus CV:  RRR, no rub, no S3, no S4 Lung:  bibasilar rales. No wheeze Abd:  soft/+BS, NT Ext:  No edema, no lymphangitis, no synovitis, no rash   Condition at discharge: stable  The results of significant diagnostics from this hospitalization (including imaging, microbiology, ancillary and laboratory) are listed below for reference.   Imaging Studies: CT CHEST WO CONTRAST Result Date: 08/18/2024 EXAM: CT CHEST WITHOUT CONTRAST 08/18/2024 04:40:11 PM TECHNIQUE: CT of the chest was performed without the administration of intravenous contrast. Multiplanar reformatted images are provided for review. Automated exposure control, iterative reconstruction, and/or weight based adjustment of the mA/kV was utilized  to reduce the radiation dose to as low as reasonably achievable. COMPARISON: 04/11/2023 CLINICAL HISTORY: Sepsis, Cough, hypoxia, dyspnea. FINDINGS: MEDIASTINUM: Heart and pericardium are unremarkable. Coronary artery and aortic atherosclerosis. The central airways are clear. LYMPH NODES: No mediastinal, hilar or axillary lymphadenopathy. LUNGS AND PLEURA: Mild centrilobular emphysema. Improved airspace opacity in the right upper lobe and right lower lobe. Dependent atelectasis in the lower lobes. Patchy nodular ground-glass opacities in the left lower lobe and posterior left upper lobe, likely infectious or inflammatory processes such as pneumonia or bronchiolitis. No pleural effusion or pneumothorax. SOFT TISSUES/BONES: No acute abnormality of the bones or soft tissues. UPPER ABDOMEN: Limited images of the upper abdomen demonstrates no acute abnormality. IMPRESSION: 1. Patchy nodular ground-glass opacities in the left lower and posterior left upper lobes, likely infectious or inflammatory such as pneumonia or bronchiolitis. 2. Improved airspace opacities in the right upper and right lower lobes. 3. Mild centrilobular emphysema. Electronically signed by: Franky Crease MD 08/18/2024 05:06 PM EST RP Workstation: [id number]   DG Chest Port 1 View Result Date: 08/18/2024 CLINICAL DATA:  Sepsis EXAM: PORTABLE CHEST 1 VIEW COMPARISON:  05/29/2023 FINDINGS: Two  frontal views of the chest demonstrate an unremarkable cardiac silhouette. No acute airspace disease, effusion, or pneumothorax. Stable background scarring. No acute bony abnormalities. IMPRESSION: 1. No acute intrathoracic process. Electronically Signed   By: Ozell Daring M.D.   On: 08/18/2024 14:03    Microbiology: Results for orders placed or performed during the hospital encounter of 08/18/24  Resp panel by RT-PCR (RSV, Flu A&B, Covid) Anterior Nasal Swab     Status: None   Collection Time: 08/18/24  1:32 PM   Specimen: Anterior Nasal Swab  Result  Value Ref Range Status   SARS Coronavirus 2 by RT PCR NEGATIVE NEGATIVE Final    Comment: (NOTE) SARS-CoV-2 target nucleic acids are NOT DETECTED.  The SARS-CoV-2 RNA is generally detectable in upper respiratory specimens during the acute phase of infection. The lowest concentration of SARS-CoV-2 viral copies this assay can detect is 138 copies/mL. A negative result does not preclude SARS-Cov-2 infection and should not be used as the sole basis for treatment or other patient management decisions. A negative result may occur with  improper specimen collection/handling, submission of specimen other than nasopharyngeal swab, presence of viral mutation(s) within the areas targeted by this assay, and inadequate number of viral copies(<138 copies/mL). A negative result must be combined with clinical observations, patient history, and epidemiological information. The expected result is Negative.  Fact Sheet for Patients:  bloggercourse.com  Fact Sheet for Healthcare Providers:  seriousbroker.it  This test is no t yet approved or cleared by the [place]  FDA and  has been authorized for detection and/or diagnosis of SARS-CoV-2 by FDA under an Emergency Use Authorization (EUA). This EUA will remain  in effect (meaning this test can be used) for the duration of the COVID-19 declaration under Section 564(b)(1) of the Act, 21 U.S.C.section 360bbb-3(b)(1), unless the authorization is terminated  or revoked sooner.       Influenza A by PCR NEGATIVE NEGATIVE Final   Influenza B by PCR NEGATIVE NEGATIVE Final    Comment: (NOTE) The Xpert Xpress SARS-CoV-2/FLU/RSV plus assay is intended as an aid in the diagnosis of influenza from Nasopharyngeal swab specimens and should not be used as a sole basis for treatment. Nasal washings and aspirates are unacceptable for Xpert Xpress SARS-CoV-2/FLU/RSV testing.  Fact Sheet for  Patients: bloggercourse.com  Fact Sheet for Healthcare Providers: seriousbroker.it  This test is not yet approved or cleared by the [place]  FDA and has been authorized for detection and/or diagnosis of SARS-CoV-2 by FDA under an Emergency Use Authorization (EUA). This EUA will remain in effect (meaning this test can be used) for the duration of the COVID-19 declaration under Section 564(b)(1) of the Act, 21 U.S.C. section 360bbb-3(b)(1), unless the authorization is terminated or revoked.     Resp Syncytial Virus by PCR NEGATIVE NEGATIVE Final    Comment: (NOTE) Fact Sheet for Patients: bloggercourse.com  Fact Sheet for Healthcare Providers: seriousbroker.it  This test is not yet approved or cleared by the [place]  FDA and has been authorized for detection and/or diagnosis of SARS-CoV-2 by FDA under an Emergency Use Authorization (EUA). This EUA will remain in effect (meaning this test can be used) for the duration of the COVID-19 declaration under Section 564(b)(1) of the Act, 21 U.S.C. section 360bbb-3(b)(1), unless the authorization is terminated or revoked.  Performed at [facility], [address] [place], [place] [address]   Culture, blood (x 2)     Status: None (Preliminary result)   Collection Time: 08/18/24  1:53 PM  Specimen: BLOOD LEFT ARM  Result Value Ref Range Status   Specimen Description BLOOD LEFT ARM BOTTLES DRAWN AEROBIC AND ANAEROBIC  Final   Special Requests Blood Culture adequate volume  Final   Culture   Final    NO GROWTH 2 DAYS Performed at [facility], [address], [place], [place] [address]    Report Status PENDING  Incomplete  Culture, blood (x 2)     Status: None (Preliminary result)   Collection Time: 08/18/24  1:53 PM   Specimen: BLOOD RIGHT ARM  Result Value Ref Range Status   Specimen Description   Final    BLOOD RIGHT ARM  BOTTLES DRAWN AEROBIC AND ANAEROBIC   Special Requests Blood Culture adequate volume  Final   Culture   Final    NO GROWTH 2 DAYS Performed at [facility], [address] [place], [place] [address]    Report Status PENDING  Incomplete    Labs: CBC: Recent Labs  Lab 08/18/24 1353 08/19/24 0410 08/20/24 0414  WBC 26.2* 11.5* 13.0*  NEUTROABS 22.1*  --   --   HGB 12.0* 10.4* 10.1*  HCT 36.0* 30.9* 30.4*  MCV 95.2 94.5 95.3  PLT 239 207 238   Basic Metabolic Panel: Recent Labs  Lab 08/18/24 1353 08/19/24 0410 08/20/24 0414  NA 137 134* 139  K 4.0 4.2 4.6  CL 97* 100 104  CO2 28 26 28   GLUCOSE 105* 207* 110*  BUN 32* 31* 29*  CREATININE 2.09* 1.55* 1.45*  CALCIUM  9.4 8.9 8.7*  MG  --   --  1.9   Liver Function Tests: Recent Labs  Lab 08/18/24 1353  AST 27  ALT 26  ALKPHOS 87  BILITOT 0.4  PROT 7.5  ALBUMIN 4.4   CBG: No results for input(s): GLUCAP in the last 168 hours.  Discharge time spent: greater than 30 minutes.  Signed: Alm Schneider, MD [facility] 08/20/2024

## 2024-08-20 NOTE — Care Management Important Message (Signed)
 Important Message  Patient Details  Name: Cesar Gonzalez MRN: [id number] Date of Birth: July 10, 1951   Important Message Given:  N/A - LOS <3 / Initial given by admissions     Cesar Gonzalez 08/20/2024, 12:06 PM

## 2024-08-21 LAB — LEGIONELLA PNEUMOPHILA SEROGP 1 UR AG: L. pneumophila Serogp 1 Ur Ag: NEGATIVE

## 2024-08-21 NOTE — ED Provider Notes (Signed)
 [facility]    CSN: [id number] Arrival date & time: 08/18/24  1111      History   Chief Complaint Chief Complaint  Patient presents with   Nasal Congestion    HPI Cesar Gonzalez is a 73 y.o. male.   Presenting today with about 2 week history of productive cough, wheezing, congestion for which he was treated with augmentin  last week. States he got mostly better, but then 3 days ago started with 101 degree fevers, progressively worsening cough, congestion, DOE, weakness. So far trying OTC remedies with minimal relief. Hx of COPD on albuterol  prn and hx of hospitalization for sepsis pneumonia.     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 Authorizing Provider  amLODipine (NORVASC) 5 MG tablet Take 5 mg by mouth daily. 08/13/24  Yes [provider]  clobetasol cream (TEMOVATE) 0.05 % Apply 1 Application topically 2 (two) times daily. Gently cover the entire affected area of upper and lower extremities. Do not apply to eyes 08/13/24  Yes [provider]  albuterol  (PROVENTIL  HFA;VENTOLIN  HFA)  108 (90 Base) MCG/ACT inhaler Inhale 2 puffs into the lungs every 4 (four) hours as needed for wheezing or shortness of breath. 09/18/18   Johnson, Clanford L, MD  ALPRAZolam  (XANAX ) 1 MG tablet Take 1 mg by mouth 3 (three) times daily as needed for anxiety. 02/26/22   [provider]  Apple Cider Vinegar 500 MG TABS Take 1 tablet by mouth daily.    [provider]  ASHWAGANDHA GUMMIES PO Take 1 tablet by mouth daily.    [provider]  azithromycin  (ZITHROMAX ) 500 MG tablet Take 1 tablet (500 mg total) by mouth daily. 08/20/24   Evonnie Lenis, MD  Barberry-Oreg Grape-Goldenseal (BERBERINE COMPLEX PO) Take 1 tablet by mouth daily.    [provider]  cefdinir  (OMNICEF ) 300 MG capsule Take 1 capsule (300 mg total) by mouth 2 (two) times daily. 08/20/24   Evonnie Lenis, MD  cholecalciferol (VITAMIN D3) 25 MCG (1000 UNIT) tablet Take 1,000 Units by mouth daily.    [provider]  folic acid (FOLVITE) 1 MG tablet Take 1 tablet (1 mg total) by mouth daily. 08/21/24   Evonnie Lenis, MD  BENN BILOBA COMPLEX PO Take 1 capsule by mouth daily.    [provider]  Misc Natural Products (TESTOPLEX PLUS PO) Take 1 capsule by mouth daily.    [provider]  Multiple Vitamins-Minerals (SUPER MULTI-VITAMIN PO) Take 1 tablet by mouth daily.    [provider]  Omega-3 Fatty Acids (  FISH OIL) 1000 MG CAPS Take 2,000 mg by mouth daily.    [provider]  Probiotic Product (PROBIOTIC ADVANCED PO) Take 1 capsule by mouth daily.    [provider]  rosuvastatin  (CRESTOR ) 5 MG tablet Take 5 mg by mouth daily.    [provider]  SYMBICORT 160-4.5 MCG/ACT inhaler Inhale 2 puffs into the lungs in the morning and at bedtime. 03/21/23   [provider]  Turmeric (QC TUMERIC COMPLEX PO) Take 1 capsule by mouth daily. Ginger    [provider]  vitamin k 100 MCG tablet Take 100 mcg by mouth daily.    [provider]    Family History Family History  Problem Relation Age of Onset   Diabetes Mother    Osteoporosis Mother    COPD Father    Prostate cancer Father    Stroke Brother    Heart failure Brother    Heart attack Maternal Grandfather    Brain cancer Paternal Grandmother    Stroke Paternal Grandfather     Social History Social History   Tobacco Use   Smoking status: Former    Current packs/day: 0.00    Average packs/day: 1 pack/day for 20.0 years (20.0 ttl pk-yrs)    Types: Cigarettes    Start date: 2002    Quit date: 2022    Years since quitting: 3.8   Smokeless tobacco: Never  Vaping Use   Vaping status: Never Used  Substance Use Topics   Alcohol use: Not Currently    Comment: Occasional   Drug use: No     Allergies   Patient has no known allergies.   Review of Systems Review of Systems PER HPI  Physical Exam Triage Vital Signs ED Triage Vitals  Encounter Vitals Group     BP 08/18/24 1214 (!) 94/58     Girls Systolic BP Percentile --      Girls Diastolic BP Percentile --      Boys Systolic BP Percentile --      Boys Diastolic BP Percentile --      Pulse Rate 08/18/24 1214 91     Resp 08/18/24 1214 18     Temp 08/18/24 1214 98.5 F (36.9 C)     Temp Source 08/18/24 1214 Oral     SpO2 08/18/24 1214 (!) 89 %     Weight --      Height --      Head Circumference --      Peak Flow --      Pain Score 08/18/24 1212 0     Pain Loc --      Pain Education --      Exclude from Growth Chart --    No data found.  Updated Vital Signs BP (!) 94/58 (BP Location: Right Arm) Comment: x2 attempts.discussed pt presentation and pt vitals with PA.  Pulse 91   Temp 98.5 F (36.9 C) (Oral)   Resp 18   SpO2 (!) 89% Comment: x2 different o2 probe sources.  Visual Acuity Right Eye Distance:   Left Eye Distance:   Bilateral Distance:    Right Eye Near:   Left Eye Near:    Bilateral Near:     Physical Exam Vitals and nursing note reviewed.  Constitutional:       Appearance: He is well-developed.  HENT:     Head: Atraumatic.     Right Ear: External ear normal.     Left Ear: External ear normal.  Nose: Congestion present.     Mouth/Throat:     Mouth: Mucous membranes are moist.     Pharynx: No oropharyngeal exudate.  Eyes:     Conjunctiva/sclera: Conjunctivae normal.     Pupils: Pupils are equal, round, and reactive to light.  Cardiovascular:     Rate and Rhythm: Regular rhythm. Tachycardia present.  Pulmonary:     Effort: Pulmonary effort is normal. No respiratory distress.     Breath sounds: Wheezing and rales present.  Musculoskeletal:        General: Normal range of motion.     Cervical back: Normal range of motion and neck supple.  Lymphadenopathy:     Cervical: No cervical adenopathy.  Skin:    General: Skin is warm and dry.  Neurological:     Mental Status: He is alert and oriented to person, place, and time.  Psychiatric:        Behavior: Behavior normal.      UC Treatments / Results  Labs (all labs ordered are listed, but only abnormal results are displayed) Labs Reviewed - No data to display  EKG   Radiology No results found.  Procedures Procedures (including critical care time)  Medications Ordered in UC Medications - No data to display  Initial Impression / Assessment and Plan / UC Course  I have reviewed the triage vital signs and the nursing notes.  Pertinent labs & imaging results that were available during my care of the patient were reviewed by me and considered in my medical decision making (see chart for details).     Suspect COPD exacerbation leading now to pneumonia and possibly sepsis based on vital signs. HR ranging from high 90s to 105 throughout time in clinic, O2 saturations on room air ranging from 87%-91%, and hypotensive. Home fevers x 3 days. Discussed need for further evaluation in the ED which he is readily agreeable to. Wishes to go via private vehicle and has someone who can drive  him.   Final Clinical Impressions(s) / UC Diagnoses   Final diagnoses:  Hypoxia  Hypotension, unspecified hypotension type  Tachycardia  DOE (dyspnea on exertion)  COPD exacerbation [facility])     Discharge Instructions      Go directly to the emergency department for further evaluation.  We do not recommend that you drive yourself for safety reasons    ED Prescriptions   None    PDMP not reviewed this encounter.   Stuart Millman [place], [place] 08/21/24 [id number]

## 2024-08-23 LAB — CULTURE, BLOOD (ROUTINE X 2)
Culture: NO GROWTH
Culture: NO GROWTH
Special Requests: ADEQUATE
Special Requests: ADEQUATE

## 2024-09-23 ENCOUNTER — Ambulatory Visit
Admission: EM | Admit: 2024-09-23 | Discharge: 2024-09-23 | Disposition: A | Discharge status: Home / Self Care | Attending: Family Medicine | Admitting: Family Medicine

## 2024-09-23 DIAGNOSIS — J069 Acute upper respiratory infection, unspecified: Secondary | ICD-10-CM | POA: Diagnosis not present

## 2024-09-23 DIAGNOSIS — J441 Chronic obstructive pulmonary disease with (acute) exacerbation: Secondary | ICD-10-CM | POA: Diagnosis not present

## 2024-09-23 LAB — POC COVID19/FLU A&B COMBO
Covid Antigen, POC: NEGATIVE
Influenza A Antigen, POC: NEGATIVE
Influenza B Antigen, POC: NEGATIVE

## 2024-09-23 MED ORDER — PROMETHAZINE-DM 6.25-15 MG/5ML PO SYRP: 5.0000 mL | ORAL_SOLUTION | Freq: Four times a day (QID) | ORAL | 0 refills | Status: AC | PRN

## 2024-09-23 MED ORDER — PREDNISONE 20 MG PO TABS: 40.0000 mg | ORAL_TABLET | Freq: Every day | ORAL | 0 refills | Status: DC

## 2024-09-23 MED ORDER — AZITHROMYCIN 250 MG PO TABS: ORAL_TABLET | ORAL | 0 refills | Status: DC

## 2024-09-23 NOTE — ED Provider Notes (Signed)
 [facility]    CSN: [id number] Arrival date & time: 09/23/24  9060      History   Chief Complaint Chief Complaint  Patient presents with   Cough    HPI Cesar Gonzalez is a 73 y.o. male.   Patient presenting today with 4-day history of congestion, cough, chest tightness, occasional shortness of breath.  Denies fever, chills, chest pain, abdominal pain, vomiting, diarrhea.  History of COPD compliant with inhaler regimen.  Notes he was recently hospitalized for pneumonia so wanted to come and get checked out.  Trying Mucinex  and Tylenol  with minimal relief.    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 Authorizing Provider  ALPRAZolam  (XANAX ) 1 MG tablet Take 1 mg by mouth 3 (three) times daily as needed for anxiety. 02/26/22  Yes [provider]  amLODipine (NORVASC) 5 MG tablet Take 5 mg by mouth daily. 08/13/24  Yes [provider]  azithromycin  (ZITHROMAX ) 250 MG tablet Take first 2 tablets together, then 1 every day until finished. 09/23/24  Yes Stuart Vernell Norris, PA-C  Multiple  Vitamins-Minerals (SUPER MULTI-VITAMIN PO) Take 1 tablet by mouth daily.   Yes [provider]  predniSONE  (DELTASONE ) 20 MG tablet Take 2 tablets (40 mg total) by mouth daily with breakfast. 09/23/24  Yes Stuart Vernell Norris, PA-C  promethazine -dextromethorphan  (PROMETHAZINE -DM) 6.25-15 MG/5ML syrup Take 5 mLs by mouth 4 (four) times daily as needed. 09/23/24  Yes Stuart Vernell Norris, PA-C  rosuvastatin  (CRESTOR ) 5 MG tablet Take 5 mg by mouth daily.   Yes [provider]  SYMBICORT 160-4.5 MCG/ACT inhaler Inhale 2 puffs into the lungs in the morning and at bedtime. 03/21/23  Yes [provider]  albuterol  (PROVENTIL  HFA;VENTOLIN  HFA) 108 (90 Base) MCG/ACT inhaler Inhale 2 puffs into the lungs every 4 (four) hours as needed for wheezing or shortness of breath. 09/18/18   Johnson, Clanford L, MD  Apple Cider Vinegar 500 MG TABS Take 1 tablet by mouth daily.    [provider]  ASHWAGANDHA GUMMIES PO Take 1 tablet by mouth daily.    [provider]  azithromycin  (ZITHROMAX ) 500 MG tablet Take 1 tablet (500 mg total) by mouth daily. 08/20/24   Evonnie Lenis, MD  Barberry-Oreg Grape-Goldenseal (BERBERINE COMPLEX PO) Take 1 tablet by mouth daily.    [provider]  cefdinir  (OMNICEF ) 300 MG capsule Take 1 capsule (300 mg total) by mouth 2 (two) times daily. 08/20/24   Evonnie Lenis, MD  cholecalciferol (VITAMIN D3) 25 MCG (1000 UNIT) tablet Take 1,000 Units by mouth  daily.    [provider]  clobetasol cream (TEMOVATE) 0.05 % Apply 1 Application topically 2 (two) times daily. Gently cover the entire affected area of upper and lower extremities. Do not apply to eyes 08/13/24   [provider]  folic acid  (FOLVITE ) 1 MG tablet Take 1 tablet (1 mg total) by mouth daily. 08/21/24   Evonnie Lenis, MD  BENN BILOBA COMPLEX PO Take 1 capsule by mouth daily.    [provider]  Misc Natural Products (TESTOPLEX PLUS PO) Take 1 capsule by  mouth daily.    [provider]  Omega-3 Fatty Acids (FISH OIL) 1000 MG CAPS Take 2,000 mg by mouth daily.    [provider]  Probiotic Product (PROBIOTIC ADVANCED PO) Take 1 capsule by mouth daily.    [provider]  Turmeric (QC TUMERIC COMPLEX PO) Take 1 capsule by mouth daily. Ginger    [provider]  vitamin k 100 MCG tablet Take 100 mcg by mouth daily.    [provider]    Family History Family History  Problem Relation Age of Onset   Diabetes Mother    Osteoporosis Mother    COPD Father    Prostate cancer Father    Stroke Brother    Heart failure Brother    Heart attack Maternal Grandfather    Brain cancer Paternal Grandmother    Stroke Paternal Grandfather     Social History Social History   Tobacco Use   Smoking status: Former    Current packs/day: 0.00    Average packs/day: 1 pack/day for 20.0 years (20.0 ttl pk-yrs)    Types: Cigarettes    Start date: 2002    Quit date: 2022    Years since quitting: 3.9   Smokeless tobacco: Never  Vaping Use   Vaping status: Never Used  Substance Use Topics   Alcohol use: Not Currently    Comment: Occasional   Drug use: No     Allergies   Patient has no known allergies.   Review of Systems Review of Systems Per HPI  Physical Exam Triage Vital Signs ED Triage Vitals  Encounter Vitals Group     BP 09/23/24 0956 (!) 145/67     Girls Systolic BP Percentile --      Girls Diastolic BP Percentile --      Boys Systolic BP Percentile --      Boys Diastolic BP Percentile --      Pulse Rate 09/23/24 0956 96     Resp 09/23/24 0956 16     Temp 09/23/24 0956 98.8 F (37.1 C)     Temp Source 09/23/24 0956 Oral     SpO2 09/23/24 1019 94 %     Weight --      Height --      Head Circumference --      Peak Flow --      Pain Score 09/23/24 0957 0     Pain Loc --      Pain Education --      Exclude from Growth Chart --    No data found.  Updated Vital Signs BP (!)  145/67 (BP Location: Right Arm)   Pulse 96   Temp 98.8 F (37.1 C) (Oral)   Resp 16   SpO2 94%   Visual Acuity Right Eye Distance:   Left Eye Distance:   Bilateral Distance:    Right Eye Near:   Left Eye Near:    Bilateral Near:  Physical Exam Vitals and nursing note reviewed.  Constitutional:      Appearance: He is well-developed.  HENT:     Head: Atraumatic.     Right Ear: External ear normal.     Left Ear: External ear normal.     Nose: Congestion present.     Mouth/Throat:     Pharynx: No oropharyngeal exudate.  Eyes:     Conjunctiva/sclera: Conjunctivae normal.     Pupils: Pupils are equal, round, and reactive to light.  Cardiovascular:     Rate and Rhythm: Normal rate.  Pulmonary:     Effort: Pulmonary effort is normal. No respiratory distress.     Breath sounds: Wheezing present. No rales.  Musculoskeletal:        General: Normal range of motion.     Cervical back: Normal range of motion and neck supple.  Lymphadenopathy:     Cervical: No cervical adenopathy.  Skin:    General: Skin is warm and dry.  Neurological:     Mental Status: He is alert and oriented to person, place, and time.  Psychiatric:        Behavior: Behavior normal.      UC Treatments / Results  Labs (all labs ordered are listed, but only abnormal results are displayed) Labs Reviewed  POC COVID19/FLU A&B COMBO - Normal    EKG   Radiology No results found.  Procedures Procedures (including critical care time)  Medications Ordered in UC Medications - No data to display  Initial Impression / Assessment and Plan / UC Course  I have reviewed the triage vital signs and the nursing notes.  Pertinent labs & imaging results that were available during my care of the patient were reviewed by me and considered in my medical decision making (see chart for details).     Rapid flu and COVID-negative, will treat for COPD exacerbation secondary to viral respiratory infection with  Zithromax , prednisone , Phenergan  DM, continued inhaler regimen.  Return for worsening or unresolving symptoms.  Final Clinical Impressions(s) / UC Diagnoses   Final diagnoses:  Viral URI with cough  COPD exacerbation [facility])   Discharge Instructions   None    ED Prescriptions     Medication Sig Dispense Auth. Provider   predniSONE  (DELTASONE ) 20 MG tablet Take 2 tablets (40 mg total) by mouth daily with breakfast. 10 tablet Stuart Vernell Norris, PA-C   azithromycin  (ZITHROMAX ) 250 MG tablet Take first 2 tablets together, then 1 every day until finished. 6 tablet Stuart Vernell Norris, PA-C   promethazine -dextromethorphan  (PROMETHAZINE -DM) 6.25-15 MG/5ML syrup Take 5 mLs by mouth 4 (four) times daily as needed. 100 mL Stuart Vernell Norris, [place]      PDMP not reviewed this encounter.   Stuart Vernell Norris, [place] 09/23/24 1614

## 2024-09-23 NOTE — ED Triage Notes (Signed)
 Cough, congestion x 4 days. Taking mucinex  and tylenol .

## 2024-10-01 ENCOUNTER — Ambulatory Visit (INDEPENDENT_AMBULATORY_CARE_PROVIDER_SITE_OTHER)

## 2024-10-01 ENCOUNTER — Ambulatory Visit
Admission: EM | Admit: 2024-10-01 | Discharge: 2024-10-01 | Disposition: A | Discharge status: Home / Self Care | Source: Home / Self Care

## 2024-10-01 DIAGNOSIS — R059 Cough, unspecified: Secondary | ICD-10-CM

## 2024-10-01 DIAGNOSIS — J441 Chronic obstructive pulmonary disease with (acute) exacerbation: Secondary | ICD-10-CM

## 2024-10-01 DIAGNOSIS — R0602 Shortness of breath: Secondary | ICD-10-CM

## 2024-10-01 MED ORDER — AMOXICILLIN-POT CLAVULANATE 875-125 MG PO TABS: 1.0000 | ORAL_TABLET | Freq: Two times a day (BID) | ORAL | 0 refills | Status: DC

## 2024-10-01 MED ORDER — GUAIFENESIN 100 MG/5ML PO LIQD: 10.0000 mL | Freq: Four times a day (QID) | ORAL | 0 refills | Status: AC | PRN

## 2024-10-01 MED ORDER — METHYLPREDNISOLONE SODIUM SUCC 125 MG IJ SOLR
125.0000 mg | Freq: Once | INTRAMUSCULAR | Status: AC
  Administered 2024-10-01: 12:00:00 125 mg via INTRAMUSCULAR

## 2024-10-01 MED ORDER — ALBUTEROL SULFATE (2.5 MG/3ML) 0.083% IN NEBU: 2.5000 mg | INHALATION_SOLUTION | Freq: Four times a day (QID) | RESPIRATORY_TRACT | 0 refills | Status: DC | PRN

## 2024-10-01 MED ORDER — PREDNISONE 20 MG PO TABS: 40.0000 mg | ORAL_TABLET | Freq: Every day | ORAL | 0 refills | Status: AC

## 2024-10-01 MED ORDER — IPRATROPIUM-ALBUTEROL 0.5-2.5 (3) MG/3ML IN SOLN
3.0000 mL | Freq: Once | RESPIRATORY_TRACT | Status: AC
  Administered 2024-10-01: 12:00:00 3 mL via RESPIRATORY_TRACT

## 2024-10-01 NOTE — Discharge Instructions (Addendum)
 You were given a nebulizer treatment and Solumedrol 125mg . Start the prednisone  tomorrow. Take medication as prescribed. Increase fluids and allow for plenty of rest. You may take Tylenol  as needed for pain, fever, or general discomfort. Continue use of your albuterol  inhaler as needed for shortness of breath, or wheezing. Monitor your symptoms for worsening. Go to the emergency department if you experience worsening shortness of breath, difficulty breathing, O2 sats fall lower than 89%, chest pain or other concerns. Follow-up with your primary care physician to discuss possible referral to pulmonology. I would like for you to follow-up with your primary care physician within the next 5 to 7 days for reevaluation. Follow-up as needed.

## 2024-10-01 NOTE — ED Triage Notes (Signed)
 Pt reports he has chest congestion, SHOB, cough x 2 weeks

## 2024-10-01 NOTE — ED Notes (Signed)
 While performing trial ambulation pt O2 stat ranged from 92-94%.

## 2024-10-01 NOTE — ED Provider Notes (Addendum)
 [facility]    CSN: [id number] Arrival date & time: 10/01/24  1047      History   Chief Complaint No chief complaint on file.   HPI OBE Cesar Gonzalez is a 73 y.o. male.   The history is provided by the patient.   Patient presents with a 2-week history of cough, chest congestion, and shortness of breath.  Patient was seen in this clinic on 09/23/2024 and diagnosed with a viral URI with an underlying COPD exacerbation.  At that time, he was treated with prednisone , azithromycin , and Promethazine  DM.  Patient states that he has been experiencing intermittent shortness of breath with exertion.  He denies fever, chills, headache, ear pain, chest pain, abdominal pain, nausea, vomiting, diarrhea, or rash.  Patient states that he is treated for his COPD by his PCP, states that he currently takes Symbicort daily.  Patient with prior history of pneumonia.  Patient denies current history of smoking.  States that his O2 sats ranged between 90 to 100%.  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 Authorizing Provider  albuterol   (PROVENTIL  HFA;VENTOLIN  HFA) 108 (90 Base) MCG/ACT inhaler Inhale 2 puffs into the lungs every 4 (four) hours as needed for wheezing or shortness of breath. 09/18/18   Johnson, Clanford L, MD  ALPRAZolam  (XANAX ) 1 MG tablet Take 1 mg by mouth 3 (three) times daily as needed for anxiety. 02/26/22   [provider]  amLODipine (NORVASC) 5 MG tablet Take 5 mg by mouth daily. 08/13/24   [provider]  Apple Cider Vinegar 500 MG TABS Take 1 tablet by mouth daily.    [provider]  ASHWAGANDHA GUMMIES PO Take 1 tablet by mouth daily.    [provider]  azithromycin  (ZITHROMAX ) 250 MG tablet Take first 2 tablets together, then 1 every day until finished. 09/23/24   Stuart Vernell Norris, PA-C  azithromycin  (ZITHROMAX ) 500 MG tablet Take 1 tablet (500 mg total) by mouth daily. 08/20/24   Evonnie Lenis, MD  Barberry-Oreg Grape-Goldenseal (BERBERINE COMPLEX PO) Take 1 tablet by mouth daily.    [provider]  cefdinir  (OMNICEF ) 300 MG capsule Take 1 capsule (300 mg total) by mouth 2 (two) times daily. 08/20/24   Evonnie Lenis, MD  cholecalciferol (VITAMIN D3) 25 MCG (1000 UNIT) tablet Take 1,000 Units by mouth daily.    [provider]  clobetasol cream (TEMOVATE) 0.05 % Apply 1 Application topically 2 (two) times daily. Gently cover the entire affected area of upper and lower extremities. Do not apply to eyes 08/13/24  [provider]  folic acid  (FOLVITE ) 1 MG tablet Take 1 tablet (1 mg total) by mouth daily. 08/21/24   Evonnie Lenis, MD  BENN BILOBA COMPLEX PO Take 1 capsule by mouth daily.    [provider]  Misc Natural Products (TESTOPLEX PLUS PO) Take 1 capsule by mouth daily.    [provider]  Multiple Vitamins-Minerals (SUPER MULTI-VITAMIN PO) Take 1 tablet by mouth daily.    [provider]  Omega-3 Fatty Acids (FISH OIL) 1000 MG CAPS Take 2,000 mg by mouth daily.    [provider]  predniSONE   (DELTASONE ) 20 MG tablet Take 2 tablets (40 mg total) by mouth daily with breakfast. 09/23/24   Stuart Vernell Norris, PA-C  Probiotic Product (PROBIOTIC ADVANCED PO) Take 1 capsule by mouth daily.    [provider]  promethazine -dextromethorphan  (PROMETHAZINE -DM) 6.25-15 MG/5ML syrup Take 5 mLs by mouth 4 (four) times daily as needed. 09/23/24   Stuart Vernell Norris, PA-C  rosuvastatin  (CRESTOR ) 5 MG tablet Take 5 mg by mouth daily.    [provider]  SYMBICORT 160-4.5 MCG/ACT inhaler Inhale 2 puffs into the lungs in the morning and at bedtime. 03/21/23   [provider]  Turmeric (QC TUMERIC COMPLEX PO) Take 1 capsule by mouth daily. Ginger    [provider]  vitamin k 100 MCG tablet Take 100 mcg by mouth daily.    [provider]    Family History Family History  Problem Relation Age of Onset   Diabetes Mother    Osteoporosis Mother    COPD Father    Prostate cancer Father    Stroke Brother    Heart failure Brother    Heart attack Maternal Grandfather    Brain cancer Paternal Grandmother    Stroke Paternal Grandfather     Social History Social History[1]   Allergies   Patient has no known allergies.   Review of Systems Review of Systems Per HPI  Physical Exam Triage Vital Signs ED Triage Vitals  Encounter Vitals Group     BP 10/01/24 1145 110/65     Girls Systolic BP Percentile --      Girls Diastolic BP Percentile --      Boys Systolic BP Percentile --      Boys Diastolic BP Percentile --      Pulse Rate 10/01/24 1147 90     Resp 10/01/24 1145 18     Temp 10/01/24 1147 98 F (36.7 C)     Temp Source 10/01/24 1145 Oral     SpO2 10/01/24 1145 (!) 89 %     Weight --      Height --      Head Circumference --      Peak Flow --      Pain Score 10/01/24 1146 0     Pain Loc --      Pain Education --      Exclude from Growth Chart --    No data found.  Updated Vital Signs BP 124/77 (BP Location: Right Arm)    Pulse 88   Temp 98 F (36.7 C) (Oral)   Resp 18   SpO2 91%   Visual Acuity Right Eye Distance:   Left Eye Distance:   Bilateral Distance:    Right Eye Near:   Left Eye Near:    Bilateral Near:     Physical Exam Vitals and nursing note reviewed.  Constitutional:      General: He is not  in acute distress.    Appearance: Normal appearance.  HENT:     Head: Normocephalic.     Right Ear: Tympanic membrane, ear canal and external ear normal.     Left Ear: Tympanic membrane, ear canal and external ear normal.     Nose: Nose normal.     Mouth/Throat:     Mouth: Mucous membranes are moist.  Eyes:     Extraocular Movements: Extraocular movements intact.     Pupils: Pupils are equal, round, and reactive to light.  Cardiovascular:     Rate and Rhythm: Normal rate and regular rhythm.     Pulses: Normal pulses.     Heart sounds: Normal heart sounds.  Pulmonary:     Effort: Pulmonary effort is normal. No accessory muscle usage or respiratory distress.     Breath sounds: Examination of the left-upper field reveals wheezing. Examination of the right-lower field reveals decreased breath sounds. Examination of the left-lower field reveals decreased breath sounds. Decreased breath sounds and wheezing present. No rhonchi.  Abdominal:     General: Bowel sounds are normal.     Palpations: Abdomen is soft.     Tenderness: There is no abdominal tenderness.  Musculoskeletal:     Cervical back: Normal range of motion.  Skin:    General: Skin is warm and dry.  Neurological:     General: No focal deficit present.     Mental Status: He is alert and oriented to person, place, and time.  Psychiatric:        Mood and Affect: Mood normal.        Behavior: Behavior normal.      UC Treatments / Results  Labs (all labs ordered are listed, but only abnormal results are displayed) Labs Reviewed - No data to display  EKG   Radiology DG Chest 2 View Result Date: 10/01/2024 CLINICAL DATA:   Cough and shortness of breath for 2 weeks EXAM: CHEST - 2 VIEW COMPARISON:  August 18, 2024 FINDINGS: The heart size and mediastinal contours are within normal limits. Both lungs are clear. The visualized skeletal structures are unremarkable. IMPRESSION: No active cardiopulmonary disease. Electronically Signed   By: Lynwood Landy Raddle M.D.   On: 10/01/2024 12:16    Procedures Procedures (including critical care time)  Medications Ordered in UC Medications  ipratropium-albuterol  (DUONEB) 0.5-2.5 (3) MG/3ML nebulizer solution 3 mL (3 mLs Nebulization Given 10/01/24 1222)  methylPREDNISolone  sodium succinate (SOLU-MEDROL ) 125 mg/2 mL injection 125 mg (125 mg Intramuscular Given 10/01/24 1220)    Initial Impression / Assessment and Plan / UC Course  I have reviewed the triage vital signs and the nursing notes.  Pertinent labs & imaging results that were available during my care of the patient were reviewed by me and considered in my medical decision making (see chart for details).  The chest x-ray was negative for active cardiopulmonary disease.  Patient presents for complaints of ongoing cough and shortness of breath.  He does have underlying history of COPD.  Patient recently seen for the same or similar symptoms on 09/23/2024.  DuoNeb and Solu-Medrol  125 mg were administered with some improvement in the patient's O2 sat.  Sats are currently at 91%.  The patient is speaking complete sentences, and is in no acute distress.  Cannot rule out ongoing COPD exacerbation.  Will treat for ongoing COPD exacerbation with prednisone  40 mg, and Augmentin  875/2025 mg.  Supportive care recommendations were provided and discussed with the patient to include fluids, rest,  and continue to monitoring his O2 saturation at home.  Patient's O2 sats at discharge were 91%.  He is speaking complete sentences, and is in no acute distress.  Patient was advised if sats drop lower than 89%, I would like for him to follow-up with  his primary care physician for further evaluation.  Patient was also given strict ER follow-up precautions to include worsening shortness of breath, difficulty breathing, or other concerns.  Patient was also advised to discuss referral to pulmonology with his PCP.  Patient was in agreement with this plan of care and verbalizes understanding.  All questions were answered.  Patient stable for discharge.  Final Clinical Impressions(s) / UC Diagnoses   Final diagnoses:  Cough, unspecified type  Shortness of breath   Discharge Instructions   None    ED Prescriptions   None    PDMP not reviewed this encounter.    Gilmer Etta PARAS, NP 10/01/24 1303     [1]  Social History Tobacco Use   Smoking status: Former    Current packs/day: 0.00    Average packs/day: 1 pack/day for 20.0 years (20.0 ttl pk-yrs)    Types: Cigarettes    Start date: 2002    Quit date: 2022    Years since quitting: 3.9   Smokeless tobacco: Never  Vaping Use   Vaping status: Never Used  Substance Use Topics   Alcohol use: Not Currently    Comment: Occasional   Drug use: No     Gilmer Etta PARAS, NP 10/01/24 1545

## 2024-10-29 ENCOUNTER — Ambulatory Visit: Admitting: Urology

## 2024-10-29 ENCOUNTER — Encounter: Payer: Self-pay | Admitting: Urology

## 2024-10-29 VITALS — BP 144/75 | HR 81 | Ht 74.0 in | Wt 198.0 lb

## 2024-10-29 DIAGNOSIS — R972 Elevated prostate specific antigen [PSA]: Secondary | ICD-10-CM | POA: Diagnosis not present

## 2024-10-29 LAB — URINALYSIS, ROUTINE W REFLEX MICROSCOPIC
Bilirubin, UA: NEGATIVE
Glucose, UA: NEGATIVE
Ketones, UA: NEGATIVE
Leukocytes,UA: NEGATIVE
Nitrite, UA: NEGATIVE
Protein,UA: NEGATIVE
RBC, UA: NEGATIVE
Specific Gravity, UA: 1.015 (ref 1.005–1.030)
Urobilinogen, Ur: 0.2 mg/dL (ref 0.2–1.0)
pH, UA: 7 (ref 5.0–7.5)

## 2024-10-29 NOTE — Progress Notes (Signed)
 "  Assessment: 1. Elevated PSA     Plan: I personally reviewed the patient's chart including provider notes, lab results. Today I had a long discussion with the patient regarding PSA and the rationale and controversies of prostate cancer early detection.  I discussed the pros and cons of further evaluation including TRUS and prostate Bx.  Potential adverse events and complications as well as standard instructions were given.  Patient expressed his understanding of these issues. I also discussed the role of additional laboratory testing, urine biomarkers, and prostate MRI in assisting with decision making regarding prostate biopsy. PHI today - will contact him with results.  Chief Complaint:  Chief Complaint  Patient presents with   Elevated PSA    History of Present Illness:  Cesar Gonzalez is a 74 y.o. male who is seen in consultation from Marvine Rush, MD for evaluation of elevated PSA. PSA results: 11/24 2.6 10/25 4.0, 3.6 - done at [facility] (no records available) 11/25 4.42  No prior history of PSA elevation.  He has a family history of prostate cancer with his father who was diagnosed at age 31 and treated with radical prostatectomy. He does not have significant lower urinary tract symptoms.  No dysuria or gross hematuria.  No history of UTIs or prostatitis. IPSS = 4/1.  Past Medical History:  Past Medical History:  Diagnosis Date   Anxiety    COPD (chronic obstructive pulmonary disease) (HCC) 09/17/2018   Essential hypertension    Gout    Mixed hyperlipidemia    Nephritis    Pneumonia     Past Surgical History:  Past Surgical History:  Procedure Laterality Date   TONSILLECTOMY      Allergies:  Allergies[1]  Family History:  Family History  Problem Relation Age of Onset   Diabetes Mother    Osteoporosis Mother    COPD Father    Prostate cancer Father    Stroke Brother    Heart failure Brother    Heart attack Maternal Grandfather     Brain cancer Paternal Grandmother    Stroke Paternal Grandfather     Social History:  Social History[2]  Review of symptoms:  Constitutional:  Negative for unexplained weight loss, night sweats, fever, chills ENT:  Negative for nose bleeds, sinus pain, painful swallowing CV:  Negative for chest pain, shortness of breath, exercise intolerance, palpitations, loss of consciousness Resp:  Negative for cough, wheezing, shortness of breath GI:  Negative for nausea, vomiting, diarrhea, bloody stools GU:  Positives noted in HPI; otherwise negative for gross hematuria, dysuria, urinary incontinence Neuro:  Negative for seizures, poor balance, limb weakness, slurred speech Psych:  Negative for lack of energy, depression, anxiety Endocrine:  Negative for polydipsia, polyuria, symptoms of hypoglycemia (dizziness, hunger, sweating) Hematologic:  Negative for anemia, purpura, petechia, prolonged or excessive bleeding, use of anticoagulants  Allergic:  Negative for difficulty breathing or choking as a result of exposure to anything; no shellfish allergy; no allergic response (rash/itch) to materials, foods  Physical exam: BP (!) 153/76   Pulse 86   Ht 6' 2 (1.88 m)   Wt 198 lb (89.8 kg)   BMI 25.42 kg/m  GENERAL APPEARANCE:  Well appearing, well developed, well nourished, NAD HEENT: Atraumatic, Normocephalic, oropharynx clear. NECK: Supple without lymphadenopathy or thyromegaly. LUNGS: Clear to auscultation bilaterally. HEART: Regular Rate and Rhythm without murmurs, gallops, or rubs. ABDOMEN: Soft, non-tender, No Masses. EXTREMITIES: Moves all extremities well.  Without clubbing, cyanosis, or edema. NEUROLOGIC:  Alert  and oriented x 3, normal gait, CN II-XII grossly intact.  MENTAL STATUS:  Appropriate. BACK:  Non-tender to palpation.  No CVAT SKIN:  Warm, dry and intact.  GU: Prostate: 40 g, nontender, no nodules Rectum: Normal tone,  no masses or tenderness    Results: U/A:  Negative     [1] No Known Allergies [2]  Social History Tobacco Use   Smoking status: Former    Current packs/day: 0.00    Average packs/day: 1 pack/day for 20.0 years (20.0 ttl pk-yrs)    Types: Cigarettes    Start date: 2002    Quit date: 2022    Years since quitting: 4.0   Smokeless tobacco: Never  Vaping Use   Vaping status: Never Used  Substance Use Topics   Alcohol use: Not Currently    Comment: Occasional   Drug use: No   "

## 2024-10-31 ENCOUNTER — Ambulatory Visit: Payer: Self-pay | Admitting: Urology

## 2024-10-31 DIAGNOSIS — R972 Elevated prostate specific antigen [PSA]: Secondary | ICD-10-CM

## 2024-10-31 LAB — PROSTATE HEALTH INDEX: Prostate Specific Ag: 2.8 ng/mL (ref 0.0–3.9)

## 2024-10-31 LAB — REFLEX INFORMATION

## 2024-11-12 ENCOUNTER — Encounter: Payer: Self-pay | Admitting: Emergency Medicine

## 2024-11-12 ENCOUNTER — Ambulatory Visit
Admission: EM | Admit: 2024-11-12 | Discharge: 2024-11-12 | Disposition: A | Discharge status: Home / Self Care | Attending: Internal Medicine | Admitting: Internal Medicine

## 2024-11-12 DIAGNOSIS — J449 Chronic obstructive pulmonary disease, unspecified: Secondary | ICD-10-CM | POA: Diagnosis not present

## 2024-11-12 DIAGNOSIS — J069 Acute upper respiratory infection, unspecified: Secondary | ICD-10-CM

## 2024-11-12 LAB — POC COVID19/FLU A&B COMBO
Covid Antigen, POC: NEGATIVE
Influenza A Antigen, POC: NEGATIVE
Influenza B Antigen, POC: NEGATIVE

## 2024-11-12 MED ORDER — DEXAMETHASONE SOD PHOSPHATE PF 10 MG/ML IJ SOLN
10.0000 mg | Freq: Once | INTRAMUSCULAR | Status: AC
  Administered 2024-11-12: 10 mg via INTRAMUSCULAR

## 2024-11-12 MED ORDER — BENZONATATE 100 MG PO CAPS: 100.0000 mg | ORAL_CAPSULE | Freq: Three times a day (TID) | ORAL | 0 refills | Status: AC

## 2024-11-12 NOTE — ED Triage Notes (Signed)
 Cough, chest congestion and fever that started yesterday.

## 2024-11-12 NOTE — Discharge Instructions (Signed)
 You have a viral illness which will improve on its own with rest, fluids, and medications to help with your symptoms.  Tylenol , guaifenesin  (plain mucinex ), and saline nasal sprays may help relieve symptoms.   Two teaspoons of honey in 1 cup of warm water every 4-6 hours may help with throat pains.  Humidifier in room at nighttime may help soothe cough (clean well daily).   Tessalon  perles 1 pill every 8 hours as needed for cough.   For chest pain, shortness of breath, inability to keep food or fluids down without vomiting, fever that does not respond to tylenol  or motrin , or any other severe symptoms, please go to the ER for further evaluation. Return to urgent care as needed, otherwise follow-up with PCP.

## 2024-11-13 ENCOUNTER — Ambulatory Visit (HOSPITAL_COMMUNITY)
Admission: RE | Admit: 2024-11-13 | Discharge: 2024-11-13 | Disposition: A | Discharge status: Home / Self Care | Source: Ambulatory Visit | Attending: Internal Medicine | Admitting: Internal Medicine

## 2024-11-13 DIAGNOSIS — R059 Cough, unspecified: Secondary | ICD-10-CM | POA: Diagnosis present

## 2024-11-13 NOTE — ED Provider Notes (Signed)
 " [facility]    CSN: [id number] Arrival date & time: 11/12/24  1222      History   Chief Complaint No chief complaint on file.   HPI Cesar Gonzalez is a 74 y.o. male.   Cesar Gonzalez is a 74 y.o. male presenting for chief complaint of cough, congestion, and sore throat with low grade fever that started yesterday on November 11, 2024. Max temp at home was 100.1 oral. Cough is productive with clear sputum. Denies recent known sick contacts with similar symptoms. History of COPD and pneumonia requiring hospitalization 3 months ago (08/18/2024) due to sepsis and acute respiratory failure with hypoxia. He had another COPD exacerbation 09/23/2024 for which he was treated outpatient with azithromycin  and prednisone  burst from urgent care, then treated with Augmentin  a few weeks later for bacterial sinusitis on 10/01/2024.  He is here today in an effort to proactively start treating illness to avoid progression of illness and hospitalization.  Using normal inhaler regimen without increased usage of rescue inhaler. 2 puffs symbicort BID and 2 puffs albuterol  in the middle of the day.  Denies new changes in sputum color, shortness of breath, chest tightness, nausea, vomiting, diarrhea, and rashes. Further denies leg swelling and orthopnea.  Checks his oxygen at home, states it has been within 94-95% on home monitor which is normal for him.      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 Authorizing Provider  benzonatate  (TESSALON ) 100 MG capsule Take 1 capsule (100 mg total) by mouth every 8 (eight) hours. 11/12/24  Yes Enedelia Dorna HERO, FNP  albuterol  (PROVENTIL ) (2.5 MG/3ML) 0.083% nebulizer solution Take 3 mLs (2.5 mg total) by nebulization every 6 (six) hours as needed for wheezing or shortness of breath. 10/01/24   Leath-Warren, Etta PARAS, NP  ALPRAZolam  (XANAX ) 1 MG tablet Take 1 mg by mouth 3 (three) times daily as needed for anxiety. 02/26/22   [provider]  amLODipine (NORVASC) 5 MG tablet Take 5 mg by mouth daily. Patient not taking: Reported on 10/29/2024 08/13/24   [provider]  Apple Cider Vinegar 500 MG TABS Take 1 tablet by mouth daily.    [provider]  ASHWAGANDHA GUMMIES PO Take 1 tablet by mouth daily.    [provider]  Barberry-Oreg Grape-Goldenseal (BERBERINE COMPLEX PO) Take 1 tablet by mouth daily.    [provider]  cefdinir  (OMNICEF ) 300 MG capsule Take 1 capsule (300 mg total) by mouth 2 (two) times daily. Patient not taking: Reported on 10/29/2024 08/20/24   Tat,  Alm, MD  cholecalciferol (VITAMIN D3) 25 MCG (1000 UNIT) tablet Take 1,000 Units by mouth daily.    [provider]  clobetasol cream (TEMOVATE) 0.05 % Apply 1 Application topically 2 (two) times daily. Gently cover the entire affected area of upper and lower extremities. Do not apply to eyes 08/13/24   [provider]  folic acid  (FOLVITE ) 1 MG tablet Take 1 tablet (1 mg total) by mouth daily. 08/21/24   Evonnie Alm, MD  BENN BILOBA COMPLEX PO Take 1  capsule by mouth daily.    [provider]  guaiFENesin  (ROBITUSSIN) 100 MG/5ML liquid Take 10 mLs by mouth every 6 (six) hours as needed for cough or to loosen phlegm. Patient not taking: Reported on 10/29/2024 10/01/24   Leath-Warren, Etta PARAS, NP  Misc Natural Products (TESTOPLEX PLUS PO) Take 1 capsule by mouth daily.    [provider]  Multiple Vitamins-Minerals (SUPER MULTI-VITAMIN PO) Take 1 tablet by mouth daily.    [provider]  Omega-3 Fatty Acids (FISH OIL) 1000 MG CAPS Take 2,000 mg by mouth daily.    [provider]  Probiotic Product (PROBIOTIC ADVANCED PO) Take 1 capsule by mouth daily.    [provider]  promethazine -dextromethorphan  (PROMETHAZINE -DM) 6.25-15 MG/5ML syrup Take 5 mLs by mouth 4 (four) times daily as needed. Patient not taking: Reported on 10/29/2024 09/23/24   Stuart Vernell Norris, PA-C  rosuvastatin  (CRESTOR ) 5 MG tablet Take 5 mg by mouth daily.    [provider]  SYMBICORT 160-4.5 MCG/ACT inhaler Inhale 2 puffs into the lungs in the morning and at bedtime. 03/21/23   [provider]  Turmeric (QC TUMERIC COMPLEX PO) Take 1 capsule by mouth daily. Ginger    [provider]  vitamin k 100 MCG tablet Take 100 mcg by mouth daily.    [provider]    Family History Family History  Problem Relation Age of Onset   Diabetes Mother    Osteoporosis Mother    COPD Father    Prostate cancer Father    Stroke Brother    Heart failure Brother    Heart attack Maternal Grandfather    Brain cancer Paternal Grandmother    Stroke Paternal Grandfather     Social History Social History[1]   Allergies   Patient has no known allergies.   Review of Systems Review of Systems Per HPI  Physical Exam Triage Vital Signs ED Triage Vitals  Encounter Vitals Group     BP 11/12/24 1425 (!) 167/76     Girls Systolic BP Percentile --      Girls Diastolic BP Percentile --      Boys  Systolic BP Percentile --      Boys Diastolic BP Percentile --      Pulse Rate 11/12/24 1425 72     Resp 11/12/24 1425 18     Temp 11/12/24 1425 97.7 F (36.5 C)     Temp Source 11/12/24 1425 Oral     SpO2 11/12/24 1425 91 %     Weight --      Height --      Head Circumference --      Peak Flow --      Pain Score 11/12/24 1423 0     Pain Loc --      Pain Education --      Exclude from Growth Chart --    No data found.  Updated Vital Signs BP (!) 167/76 (BP Location: Right  Arm)   Pulse 72   Temp 97.7 F (36.5 C) (Oral)   Resp 18   SpO2 91%   Visual Acuity Right Eye Distance:   Left Eye Distance:   Bilateral Distance:    Right Eye Near:   Left Eye Near:    Bilateral Near:     Physical Exam Vitals and nursing note reviewed.  Constitutional:      Appearance: He is not ill-appearing or toxic-appearing.  HENT:     Head: Normocephalic and atraumatic.     Right Ear: Hearing, tympanic membrane, ear canal and external ear normal.     Left Ear: Hearing, tympanic membrane, ear canal and external ear normal.     Nose: Nose normal.     Mouth/Throat:     Lips: Pink.     Mouth: Mucous membranes are moist. No injury or oral lesions.     Dentition: Normal dentition.     Tongue: No lesions.     Pharynx: Oropharynx is clear. Uvula midline. No pharyngeal swelling, oropharyngeal exudate, posterior oropharyngeal erythema, uvula swelling or postnasal drip.     Tonsils: No tonsillar exudate.  Eyes:     General: Lids are normal. Vision grossly intact. Gaze aligned appropriately.     Extraocular Movements: Extraocular movements intact.     Conjunctiva/sclera: Conjunctivae normal.  Neck:     Trachea: Trachea and phonation normal.  Cardiovascular:     Rate and Rhythm: Normal rate and regular rhythm.     Heart sounds: Normal heart sounds, S1 normal and S2 normal.  Pulmonary:     Effort: Pulmonary effort is normal. No respiratory distress.     Breath sounds: Normal air entry. Rhonchi  present. No wheezing or rales.     Comments: Rhonchi to the right lower, middle, and upper lung fields. Speaking in full sentences without increased respiratory effort or difficulty.  Chest:     Chest wall: No tenderness.  Musculoskeletal:     Cervical back: Neck supple.     Right lower leg: No edema.     Left lower leg: No edema.  Lymphadenopathy:     Cervical: No cervical adenopathy.  Skin:    General: Skin is warm and dry.     Capillary Refill: Capillary refill takes less than 2 seconds.     Findings: No rash.  Neurological:     General: No focal deficit present.     Mental Status: He is alert and oriented to person, place, and time. Mental status is at baseline.     Cranial Nerves: No dysarthria or facial asymmetry.  Psychiatric:        Mood and Affect: Mood normal.        Speech: Speech normal.        Behavior: Behavior normal.        Thought Content: Thought content normal.        Judgment: Judgment normal.      UC Treatments / Results  Labs (all labs ordered are listed, but only abnormal results are displayed) Labs Reviewed  POC COVID19/FLU A&B COMBO - Normal    EKG   Radiology   Procedures Procedures (including critical care time)  Medications Ordered in UC Medications  dexamethasone  (DECADRON ) injection 10 mg (10 mg Intramuscular Given 11/12/24 1527)    Initial Impression / Assessment and Plan / UC Course  I have reviewed the triage vital signs and the nursing notes.  Pertinent labs & imaging results that were available during my care of  the patient were reviewed by me and considered in my medical decision making (see chart for details).   1. Viral URI with cough, COPD unspecified COVID-19 and influenza A/B testing negative. Low suspicion for acute copd exacerbation at this moment as sputum color is clear, his subjective respiratory status is to baseline, and he is overall well appearing with hemodynamically stable vital signs.   Dexamethasone  10mg  IM  given to reduce inflammation the lungs.  Chest x-ray ordered to be performed at [facility] as we do not have imaging capabilities in the clinic today to rule out pneumonia given focal rhonchi heard on exam.  He appears euvolemic.  Antibiotic therapy is not clinically indicated at this time, patient is agreeable with this and will continue to monitor for changes in sputum color and changes in respiratory status/shortness of breath and inhaler use.   I have advised him to have a low threshold for returning to clinic/going to ER given risk factors for rehospitalization and severe illness given recent sepsis.   Continue tylenol  and mucinex  PRN, continue use of inhalers, and follow-up with PCP in 1 week or return to urgent care/ER sooner if symptoms worsen.   Counseled patient on potential for adverse effects with medications prescribed/recommended today, strict ER and return-to-clinic precautions discussed, patient verbalized understanding.     Final Clinical Impressions(s) / UC Diagnoses   Final diagnoses:  Viral URI with cough  Chronic obstructive pulmonary disease, unspecified COPD type (HCC)     Discharge Instructions      You have a viral illness which will improve on its own with rest, fluids, and medications to help with your symptoms.  Tylenol , guaifenesin  (plain mucinex ), and saline nasal sprays may help relieve symptoms.   Two teaspoons of honey in 1 cup of warm water every 4-6 hours may help with throat pains.  Humidifier in room at nighttime may help soothe cough (clean well daily).   Tessalon  perles 1 pill every 8 hours as needed for cough.   For chest pain, shortness of breath, inability to keep food or fluids down without vomiting, fever that does not respond to tylenol  or motrin , or any other severe symptoms, please go to the ER for further evaluation. Return to urgent care as needed, otherwise follow-up with PCP.     ED Prescriptions     Medication Sig  Dispense Auth. Provider   benzonatate  (TESSALON ) 100 MG capsule Take 1 capsule (100 mg total) by mouth every 8 (eight) hours. 21 capsule Enedelia Dorna HERO, FNP      PDMP not reviewed this encounter.    [1]  Social History Tobacco Use   Smoking status: Former    Current packs/day: 0.00    Average packs/day: 1 pack/day for 20.0 years (20.0 ttl pk-yrs)    Types: Cigarettes    Start date: 2002    Quit date: 2022    Years since quitting: 4.0   Smokeless tobacco: Never  Vaping Use   Vaping status: Never Used  Substance Use Topics   Alcohol use: Not Currently    Comment: Occasional   Drug use: No     Enedelia Dorna HERO, FNP 11/13/24 2149  "

## 2024-11-14 ENCOUNTER — Ambulatory Visit: Payer: Self-pay | Admitting: Internal Medicine

## 2024-11-15 ENCOUNTER — Telehealth: Payer: Self-pay

## 2024-11-15 ENCOUNTER — Ambulatory Visit (INDEPENDENT_AMBULATORY_CARE_PROVIDER_SITE_OTHER)

## 2024-11-15 ENCOUNTER — Ambulatory Visit
Admission: EM | Admit: 2024-11-15 | Discharge: 2024-11-15 | Disposition: A | Discharge status: Home / Self Care | Attending: Physician Assistant | Admitting: Physician Assistant

## 2024-11-15 DIAGNOSIS — J441 Chronic obstructive pulmonary disease with (acute) exacerbation: Secondary | ICD-10-CM

## 2024-11-15 DIAGNOSIS — R0602 Shortness of breath: Secondary | ICD-10-CM

## 2024-11-15 MED ORDER — PREDNISONE 20 MG PO TABS: 40.0000 mg | ORAL_TABLET | Freq: Every day | ORAL | 0 refills | Status: AC

## 2024-11-15 MED ORDER — AMOXICILLIN-POT CLAVULANATE 875-125 MG PO TABS: 1.0000 | ORAL_TABLET | Freq: Two times a day (BID) | ORAL | 0 refills | Status: DC

## 2024-11-15 MED ORDER — IPRATROPIUM-ALBUTEROL 0.5-2.5 (3) MG/3ML IN SOLN
3.0000 mL | Freq: Once | RESPIRATORY_TRACT | Status: AC
  Administered 2024-11-15: 3 mL via RESPIRATORY_TRACT

## 2024-11-15 MED ORDER — PREDNISONE 20 MG PO TABS: 40.0000 mg | ORAL_TABLET | Freq: Every day | ORAL | 0 refills | Status: DC

## 2024-11-15 MED ORDER — ALBUTEROL SULFATE (2.5 MG/3ML) 0.083% IN NEBU
2.5000 mg | INHALATION_SOLUTION | Freq: Once | RESPIRATORY_TRACT | Status: AC
  Administered 2024-11-15: 2.5 mg via RESPIRATORY_TRACT

## 2024-11-15 MED ORDER — ALBUTEROL SULFATE (2.5 MG/3ML) 0.083% IN NEBU: 2.5000 mg | INHALATION_SOLUTION | Freq: Four times a day (QID) | RESPIRATORY_TRACT | 12 refills | Status: DC | PRN

## 2024-11-15 MED ORDER — AMOXICILLIN-POT CLAVULANATE 875-125 MG PO TABS: 1.0000 | ORAL_TABLET | Freq: Two times a day (BID) | ORAL | 0 refills | Status: AC

## 2024-11-15 MED ORDER — ALBUTEROL SULFATE (2.5 MG/3ML) 0.083% IN NEBU: 2.5000 mg | INHALATION_SOLUTION | Freq: Four times a day (QID) | RESPIRATORY_TRACT | 12 refills | Status: AC | PRN

## 2024-11-15 MED ORDER — METHYLPREDNISOLONE SODIUM SUCC 125 MG IJ SOLR
60.0000 mg | Freq: Once | INTRAMUSCULAR | Status: AC
  Administered 2024-11-15: 60 mg via INTRAMUSCULAR

## 2024-11-15 NOTE — Telephone Encounter (Signed)
 Pt called stating [facility] was closed due to the weather. [facility] approved by pt

## 2024-11-15 NOTE — ED Triage Notes (Signed)
 Pt reports he has a cough, sore throat, SHOB since this morning     Took mucinex , Symbicort

## 2024-11-15 NOTE — Discharge Instructions (Addendum)
 I am very glad that you are feeling better with the medication.  Use the albuterol  nebulizer solution every 4-6 hours as needed.  Monitor your oxygen saturation at home and if this drops below 90% you need to go to the emergency room as we discussed.  We gave you an injection of steroids today so please start prednisone  tomorrow (1/31/202 6) do not take NSAIDs with this medication including aspirin , ibuprofen /Advil , naproxen/Aleve.  Start Augmentin  twice daily for 7 days.  Follow-up with your primary care first thing next week.  If anything worsens you must go to the ER.

## 2024-11-15 NOTE — ED Notes (Signed)
 Pt given instructions on how to use neb machine. Pt verbalized understanding

## 2024-11-15 NOTE — ED Notes (Addendum)
 Performed an ambulation test on pt. Pt returned to room and began to sit in tripod position. Pt admitted to being [facility].    O2 stat on exertion 92-93%  Increased HR 107-115

## 2025-01-22 ENCOUNTER — Other Ambulatory Visit
